# Patient Record
Sex: Male | Born: 1990 | Race: Black or African American | Hispanic: No | Marital: Single | Smoking: Former smoker
Health system: Southern US, Community
[De-identification: ages and names within clinical notes are randomized; demographics above are authoritative.]

## PROBLEM LIST (undated history)

## (undated) DIAGNOSIS — Z789 Other specified health status: Secondary | ICD-10-CM

---

## 2008-05-06 ENCOUNTER — Encounter (INDEPENDENT_AMBULATORY_CARE_PROVIDER_SITE_OTHER): Payer: Self-pay | Admitting: Orthopedic Surgery

## 2008-05-06 ENCOUNTER — Ambulatory Visit (HOSPITAL_BASED_OUTPATIENT_CLINIC_OR_DEPARTMENT_OTHER): Admission: RE | Admit: 2008-05-06 | Discharge: 2008-05-06 | Payer: Self-pay | Admitting: Orthopedic Surgery

## 2008-05-06 HISTORY — PX: GANGLION CYST EXCISION: SHX1691

## 2009-01-16 ENCOUNTER — Emergency Department (HOSPITAL_COMMUNITY): Admission: EM | Admit: 2009-01-16 | Discharge: 2009-01-16 | Payer: Self-pay | Admitting: Emergency Medicine

## 2011-01-01 NOTE — Op Note (Signed)
NAMEMarland Kitchen  NOVAK, STGERMAINE NO.:  [id number]   MEDICAL RECORD NO.:  [id number]          PATIENT TYPE:  AMB   LOCATION:  DSC                          FACILITY:  [facility]   PHYSICIAN:  Katy Fitch. Sypher, M.D. DATE OF BIRTH:  Mar 24, 1991   DATE OF PROCEDURE:  05/06/2008  DATE OF DISCHARGE:                               OPERATIVE REPORT   PREOPERATIVE DIAGNOSIS:  Painful mass, dorsal aspect left wrist, rule  out tenosynovitis versus dorsal ganglion cyst.   POSTOPERATIVE DIAGNOSIS:  Dorsal ganglion cyst originating over the  midcarpal joint and scapholunate ligament.   OPERATION:  Resection of dorsal myxomatous cyst, left wrist.   OPERATING SURGEON:  Katy Fitch. Sypher, MD   ASSISTANT:  Annye Rusk PA-C   ANESTHESIA:  General by LMA.   SUPERVISING ANESTHESIOLOGIST:  Dr. Jean Rosenthal.   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  He is a member of the [employer].   Several months back he noted a fullness of the dorsal aspect of his left  wrist and after repetitively practicing drumming and while playing for  football games, he has had increasing pain.  Clinical examination  revealed a mass in the dorsal aspect of the wrist.  He is referred by  his primary care physician for evaluation and management of this  predicament.   Clinical examination revealed a probable ganglion cyst.  It was not  mobile with finger extensor motion.   We advised proceeding with excisional biopsy.   Preoperatively, Clinton Roach and his father were carefully advised that  myxomatous cyst or ganglions are degenerative in nature.  We are unable  to sacrifice their tissue of origin.  Because of this, despite our best  efforts at debridement, there is always a small risk of recurrence.  They fully appreciate this fact.   After informed consent, Clinton Roach is brought to the operating room at  this time.  Preoperatively, he was interviewed by Dr.  Jean Rosenthal, who  recommended general anesthesia by LMA technique.  He is brought to room  one, placed in supine position on the operating table and under Dr.  Edison Pace direct supervision, general anesthesia by LMA technique  induced.   The left arm was prepped with Betadine soap solution, sterilely draped.  A pneumatic tourniquet was applied in proximal brachium.  Following  exsanguination of the left arm with Esmarch bandage, the arterial  tourniquet was inflated to 250 mmHg.   Procedure commenced with a short transverse incision directly over the  mass.  Subcutaneous tissues were carefully divided identifying a  myxomatous cyst that was presenting between the ring and long finger  extensors.  The cyst was meticulously dissected circumferentially with  release of synovium.  It had a broad-based origin directly over the  midcarpal joint.  This was dissected to the capsule, sparing the dorsal  intercarpal ligament structures.  This was followed directly to the  distal margin of the scapholunate ligament.  The cyst was removed with  rongeurs directly off the ligament followed by electrocautery of the  surrounding tissues.   No  other areas of mucin formation were identified.  The wound was  irrigated and subsequently repaired with subdermal sutures of 4-0 Vicryl  and intradermal 2-0 Prolene.  A compressive dressing was applied with a  bolster to provide compression in this region.   Clinton Roach tolerated the procedure well.  He was awakened from general  anesthesia, transferred to recovery room with stable vital signs.   We will see him back for followup in the office in 1 week.  At that  time, we anticipate suture removal, Steri-Strips application, and  initiation of range of motion program.   We have told him he may return to practicing on the [employer] in  approximately 10 days.      Katy Fitch Sypher, M.D.  Electronically Signed     RVS/MEDQ  D:  05/06/2008  T:  05/07/2008   Job:  [id number]

## 2012-11-17 DIAGNOSIS — Z0271 Encounter for disability determination: Secondary | ICD-10-CM

## 2012-12-04 ENCOUNTER — Ambulatory Visit: Payer: 59 | Admitting: Family Medicine

## 2012-12-04 ENCOUNTER — Ambulatory Visit: Payer: 59

## 2012-12-04 VITALS — BP 136/84 | HR 46 | Temp 98.1°F | Resp 16 | Ht 70.0 in | Wt 153.0 lb

## 2012-12-04 DIAGNOSIS — M25539 Pain in unspecified wrist: Secondary | ICD-10-CM

## 2012-12-04 DIAGNOSIS — M25532 Pain in left wrist: Secondary | ICD-10-CM

## 2012-12-04 MED ORDER — MELOXICAM 7.5 MG PO TABS: 7.5000 mg | ORAL_TABLET | Freq: Every day | ORAL | Status: DC

## 2012-12-04 NOTE — Patient Instructions (Addendum)
We are going to refer you back to see Dr. Teressa Senter.  If any other problems or worsening in the meantime please let us know  Use the splint as needed for comfort and the mobic as needed for pain

## 2012-12-04 NOTE — Progress Notes (Signed)
[facility] [address] [place] [address] [phone]  Date:  12/04/2012   Name:  Clinton Roach   DOB:  1991-07-19   MRN:  [id number]  PCP:  No primary provider on file.    Chief Complaint: Wrist Pain   History of Present Illness:  Clinton Roach is a 21 y.o. very pleasant male patient who presents with the following:  He had a ganglion cyst removed from his left wrist by Dr. Teressa Senter in 2009. The cyst returned about 3 years ago.  Normally it will wax and wane in size, but not be painful.  2 or 3 weeks ago he noted onset of pain from the cyst. The ring and pinky fingers of the left hand are now also going numb- this comes and goes.  No injury to the wrist.  He is otherwise generally healthy.   There is no problem list on file for this patient.   History reviewed. No pertinent past medical history.  Past Surgical History  Procedure Laterality Date  . Wrist surgery Left 2009    History  Substance Use Topics  . Smoking status: Current Some Day Smoker  . Smokeless tobacco: Not on file  . Alcohol Use: Yes    History reviewed. No pertinent family history.  No Known Allergies  Medication list has been reviewed and updated.  No current outpatient prescriptions on file prior to visit.   No current facility-administered medications on file prior to visit.    Review of Systems:  As per HPI- otherwise negative.   Physical Examination: Filed Vitals:   12/04/12 1746  BP: 136/84  Pulse: 46  Temp: 98.1 F (36.7 C)  Resp: 16   Filed Vitals:   12/04/12 1746  Height: 5\' 10"  (1.778 m)  Weight: 153 lb (69.4 kg)   Body mass index is 21.95 kg/(m^2). Ideal Body Weight: Weight in (lb) to have BMI = 25: 173.9 Noted pulse in the 40s x 2 sets of VS when he was at the ED in 2010  GEN: WDWN, NAD, Non-toxic, A & O x 3, healthy and fit appearing HEENT: Atraumatic, Normocephalic. Neck supple. No masses, No LAD. Ears and Nose: No external  deformity. CV: RRR, No M/G/R. No JVD. No thrill. No extra heart sounds. PULM: CTA B, no wheezes, crackles, rhonchi. No retractions. No resp. distress. No accessory muscle use. EXTR: No c/c/e NEURO Normal gait.  PSYCH: Normally interactive. Conversant. Not depressed or anxious appearing.  Calm demeanor.  Left wrist: scar from past surgery on dorsum.  Proximal to this scar is a nickel sized, slightly raised, tender area that seems consistent with a cyst.  No redness or evidence of infection.  He has normal strength of the hand and wrist except for slightly decreased grip strength on the left.  Also complains of decreased sensation in the left ring and pinky fingers compared to the right.  Normal cap refill, strong radial pulse  [facility] reading (PRIMARY) by  Dr. Patsy Lager. Left wrist: negative LEFT WRIST - COMPLETE 3+ VIEW  Comparison: None  Findings: No acute bony abnormality. Specifically, no fracture, subluxation, or dislocation. Soft tissues are intact. Joint spaces are maintained. Normal bone mineralization.  IMPRESSION: No bony abnormality.  Assessment and Plan: Pain, wrist joint, left - Plan: DG Wrist Complete Left, meloxicam (MOBIC) 7.5 MG tablet, Ambulatory referral to Hand Surgery  Probably recurrent ganglion cyst.  Will refer back to Dr. Teressa Senter.  Mobic as needed for pain and placed  in a non- spica splint.    Meds ordered this encounter  Medications  . meloxicam (MOBIC) 7.5 MG tablet    Sig: Take 1 tablet (7.5 mg total) by mouth daily. Take one or two tablets a day as needed for wrist pain    Dispense:  45 tablet    Refill:  0    Signed Abbe Amsterdam, MD

## 2012-12-11 ENCOUNTER — Telehealth: Payer: Self-pay

## 2012-12-11 NOTE — Telephone Encounter (Signed)
Clinton Roach is calling about short term disability Claim # is [id number] Call back number is [phone]

## 2012-12-14 ENCOUNTER — Telehealth: Payer: Self-pay | Admitting: *Deleted

## 2012-12-14 NOTE — Telephone Encounter (Signed)
Telephoned pt in regards to [employer] disability- Number has been disconnected. Telephoned [employer]- The claim is actually through Dr. Teressa Senter, they just need the office note and xray results faxed in for review. Paperwork does not need to be completed. Faxed to [phone]

## 2012-12-15 ENCOUNTER — Other Ambulatory Visit: Payer: Self-pay | Admitting: Orthopedic Surgery

## 2012-12-15 ENCOUNTER — Encounter (HOSPITAL_BASED_OUTPATIENT_CLINIC_OR_DEPARTMENT_OTHER): Payer: Self-pay | Admitting: *Deleted

## 2012-12-16 NOTE — H&P (Addendum)
  Clinton Roach is an 22 y.o. male.   Chief Complaint: c/o mass left wrist dorsal aspect. HPI:  Clinton Roach is a well known 22 year-old gentleman who works as Therapist, music at [employer].  He has history of pain and swelling over the dorsal aspect of his left wrist.  In September, 2009 we removed a significant dorsal ganglion cyst from his midcarpal joint on the left.  He had a lengthy period of symptom free enjoyment of using his wrist vigorously on the job at [employer].  During the past two months he has had pain on the dorsal aspect of his wrist aggravated by full dorsal flexion or full palmar flexion that lancinates in the forearm.  He has been on meloxicam 7.5 mg. b.i.d. for pain without significant relief.   No past medical history on file.  Past Surgical History  Procedure Laterality Date  . Ganglion cyst excision Left 05/06/2008    resection dorsal cyst left wrist    No family history on file. Social History:  reports that he has been smoking.  He does not have any smokeless tobacco history on file. He reports that  drinks alcohol. He reports that he does not use illicit drugs.  Allergies: No Known Allergies  No prescriptions prior to admission    No results found for this or any previous visit (from the past 48 hour(s)).  No results found.   Pertinent items are noted in HPI.  There were no vitals taken for this visit.  General appearance: alert Head: Normocephalic, without obvious abnormality Neck: supple, symmetrical, trachea midline Resp: clear to auscultation bilaterally Cardio: regular rate and rhythm GI: normal findings: bowel sounds normal Extremities:.  His symptoms appear to be provoked by wrist motion. He has a well healed surgical incision dorsally. Neurovascular exam is within normal limits. He has FROM of his digits.    X-rays of his wrist were obtained at the [facility] on 12[facility]04[facility]12.  A copy of these films, three views, AP, lateral and oblique are reviewed.  He  has normal appearing wrist anatomy.  He is ulnar neutral.   Pulses: 2+ and symmetric Skin: normal Neurologic: Grossly normal    Assessment/Plan Impression: Mass left wrist dorsal aspect with probable posterior interosseous nerve compression  Plan: To the OR for excision of mass of the left wrist.The procedure, risks,benefits and post-op course were discussed with the patient at length and they were in agreement with the plan.  DASNOIT,Chrissie Dacquisto J 12/16/2012, 3:51 PM   H&P documentation: 12/17/2012  -History and Physical Reviewed  -Patient has been re-examined  -No change in the plan of care  Wyn Forster, MD

## 2012-12-17 ENCOUNTER — Ambulatory Visit (HOSPITAL_BASED_OUTPATIENT_CLINIC_OR_DEPARTMENT_OTHER): Payer: 59 | Admitting: Anesthesiology

## 2012-12-17 ENCOUNTER — Encounter (HOSPITAL_BASED_OUTPATIENT_CLINIC_OR_DEPARTMENT_OTHER): Payer: Self-pay | Admitting: Anesthesiology

## 2012-12-17 ENCOUNTER — Ambulatory Visit (HOSPITAL_BASED_OUTPATIENT_CLINIC_OR_DEPARTMENT_OTHER)
Admission: RE | Admit: 2012-12-17 | Discharge: 2012-12-17 | Disposition: A | Discharge status: Home / Self Care | Payer: 59 | Source: Ambulatory Visit | Attending: Orthopedic Surgery | Admitting: Orthopedic Surgery

## 2012-12-17 ENCOUNTER — Encounter (HOSPITAL_BASED_OUTPATIENT_CLINIC_OR_DEPARTMENT_OTHER)
Admission: RE | Disposition: A | Discharge status: Home / Self Care | Payer: Self-pay | Source: Ambulatory Visit | Attending: Orthopedic Surgery

## 2012-12-17 ENCOUNTER — Encounter (HOSPITAL_BASED_OUTPATIENT_CLINIC_OR_DEPARTMENT_OTHER): Payer: Self-pay

## 2012-12-17 DIAGNOSIS — M6740 Ganglion, unspecified site: Secondary | ICD-10-CM | POA: Insufficient documentation

## 2012-12-17 HISTORY — PX: GANGLION CYST EXCISION: SHX1691

## 2012-12-17 HISTORY — DX: Other specified health status: Z78.9

## 2012-12-17 LAB — POCT HEMOGLOBIN-HEMACUE: Hemoglobin: 15.7 g/dL (ref 13.0–17.0)

## 2012-12-17 SURGERY — EXCISION, GANGLION CYST, WRIST
Anesthesia: General | Site: Wrist | Laterality: Left | Wound class: Clean

## 2012-12-17 MED ORDER — PROPOFOL 10 MG/ML IV BOLUS
INTRAVENOUS | Status: DC | PRN
  Administered 2012-12-17: 200 mg via INTRAVENOUS

## 2012-12-17 MED ORDER — ONDANSETRON HCL 4 MG/2ML IJ SOLN
INTRAMUSCULAR | Status: DC | PRN
  Administered 2012-12-17: 4 mg via INTRAVENOUS

## 2012-12-17 MED ORDER — GLYCOPYRROLATE 0.2 MG/ML IJ SOLN
INTRAMUSCULAR | Status: DC | PRN
  Administered 2012-12-17: 0.2 mg via INTRAVENOUS

## 2012-12-17 MED ORDER — LIDOCAINE HCL (CARDIAC) 20 MG/ML IV SOLN
INTRAVENOUS | Status: DC | PRN
  Administered 2012-12-17: 100 mg via INTRAVENOUS

## 2012-12-17 MED ORDER — FENTANYL CITRATE 0.05 MG/ML IJ SOLN
INTRAMUSCULAR | Status: DC | PRN
  Administered 2012-12-17: 50 ug via INTRAVENOUS
  Administered 2012-12-17: 100 ug via INTRAVENOUS

## 2012-12-17 MED ORDER — MIDAZOLAM HCL 5 MG/5ML IJ SOLN
INTRAMUSCULAR | Status: DC | PRN
  Administered 2012-12-17: 2 mg via INTRAVENOUS

## 2012-12-17 MED ORDER — OXYCODONE HCL 5 MG/5ML PO SOLN: 5.0000 mg | Freq: Once | ORAL | Status: AC | PRN

## 2012-12-17 MED ORDER — CEFAZOLIN SODIUM-DEXTROSE 2-3 GM-% IV SOLR: 2.0000 g | INTRAVENOUS | Status: DC

## 2012-12-17 MED ORDER — LIDOCAINE HCL 2 % IJ SOLN
INTRAMUSCULAR | Status: DC | PRN
  Administered 2012-12-17: 3 mL

## 2012-12-17 MED ORDER — MEPERIDINE HCL 25 MG/ML IJ SOLN: 6.2500 mg | INTRAMUSCULAR | Status: DC | PRN

## 2012-12-17 MED ORDER — MIDAZOLAM HCL 2 MG/ML PO SYRP: 0.5000 mg/kg | ORAL_SOLUTION | Freq: Once | ORAL | Status: DC | PRN

## 2012-12-17 MED ORDER — OXYCODONE HCL 5 MG PO TABS
5.0000 mg | ORAL_TABLET | Freq: Once | ORAL | Status: AC | PRN
  Administered 2012-12-17: 5 mg via ORAL

## 2012-12-17 MED ORDER — DEXAMETHASONE SODIUM PHOSPHATE 4 MG/ML IJ SOLN
INTRAMUSCULAR | Status: DC | PRN
  Administered 2012-12-17: 8 mg via INTRAVENOUS

## 2012-12-17 MED ORDER — OXYCODONE-ACETAMINOPHEN 5-325 MG PO TABS: ORAL_TABLET | ORAL | Status: DC

## 2012-12-17 MED ORDER — MIDAZOLAM HCL 2 MG/2ML IJ SOLN: 1.0000 mg | INTRAMUSCULAR | Status: DC | PRN

## 2012-12-17 MED ORDER — HYDROMORPHONE HCL PF 1 MG/ML IJ SOLN
0.2500 mg | INTRAMUSCULAR | Status: DC | PRN
  Administered 2012-12-17 (×2): 0.5 mg via INTRAVENOUS

## 2012-12-17 MED ORDER — FENTANYL CITRATE 0.05 MG/ML IJ SOLN: 50.0000 ug | INTRAMUSCULAR | Status: DC | PRN

## 2012-12-17 MED ORDER — CHLORHEXIDINE GLUCONATE 4 % EX LIQD: 60.0000 mL | Freq: Once | CUTANEOUS | Status: DC

## 2012-12-17 MED ORDER — LACTATED RINGERS IV SOLN
INTRAVENOUS | Status: DC
  Administered 2012-12-17 (×2): via INTRAVENOUS

## 2012-12-17 MED ORDER — ONDANSETRON HCL 4 MG/2ML IJ SOLN: 4.0000 mg | Freq: Once | INTRAMUSCULAR | Status: DC | PRN

## 2012-12-17 SURGICAL SUPPLY — 42 items
BANDAGE ADHESIVE 1X3 (GAUZE/BANDAGES/DRESSINGS) IMPLANT
BANDAGE ELASTIC 3 VELCRO ST LF (GAUZE/BANDAGES/DRESSINGS) ×2 IMPLANT
BANDAGE GAUZE ELAST BULKY 4 IN (GAUZE/BANDAGES/DRESSINGS) IMPLANT
BLADE MINI RND TIP GREEN BEAV (BLADE) IMPLANT
BLADE SURG 15 STRL LF DISP TIS (BLADE) ×1 IMPLANT
BLADE SURG 15 STRL SS (BLADE) ×1
BNDG ESMARK 4X9 LF (GAUZE/BANDAGES/DRESSINGS) IMPLANT
BRUSH SCRUB EZ PLAIN DRY (MISCELLANEOUS) ×2 IMPLANT
CLOTH BEACON ORANGE TIMEOUT ST (SAFETY) ×2 IMPLANT
CORDS BIPOLAR (ELECTRODE) ×2 IMPLANT
COVER MAYO STAND STRL (DRAPES) ×2 IMPLANT
COVER TABLE BACK 60X90 (DRAPES) ×2 IMPLANT
CUFF TOURNIQUET SINGLE 18IN (TOURNIQUET CUFF) ×2 IMPLANT
DECANTER SPIKE VIAL GLASS SM (MISCELLANEOUS) IMPLANT
DRAPE EXTREMITY T 121X128X90 (DRAPE) ×2 IMPLANT
DRAPE SURG 17X23 STRL (DRAPES) ×2 IMPLANT
GLOVE BIOGEL M STRL SZ7.5 (GLOVE) ×2 IMPLANT
GLOVE BIOGEL PI IND STRL 6.5 (GLOVE) ×2 IMPLANT
GLOVE BIOGEL PI INDICATOR 6.5 (GLOVE) ×2
GLOVE ECLIPSE 6.5 STRL STRAW (GLOVE) ×2 IMPLANT
GLOVE ORTHO TXT STRL SZ7.5 (GLOVE) ×2 IMPLANT
GOWN BRE IMP PREV XXLGXLNG (GOWN DISPOSABLE) ×4 IMPLANT
GOWN PREVENTION PLUS XLARGE (GOWN DISPOSABLE) ×2 IMPLANT
NEEDLE 27GAX1X1/2 (NEEDLE) ×2 IMPLANT
PACK BASIN DAY SURGERY FS (CUSTOM PROCEDURE TRAY) ×2 IMPLANT
PAD CAST 3X4 CTTN HI CHSV (CAST SUPPLIES) ×1 IMPLANT
PADDING CAST ABS 4INX4YD NS (CAST SUPPLIES) ×1
PADDING CAST ABS COTTON 4X4 ST (CAST SUPPLIES) ×1 IMPLANT
PADDING CAST COTTON 3X4 STRL (CAST SUPPLIES) ×1
SPLINT PLASTER CAST XFAST 3X15 (CAST SUPPLIES) ×4 IMPLANT
SPLINT PLASTER XTRA FASTSET 3X (CAST SUPPLIES) ×4
SPONGE GAUZE 4X4 12PLY (GAUZE/BANDAGES/DRESSINGS) ×2 IMPLANT
STOCKINETTE 4X48 STRL (DRAPES) ×2 IMPLANT
STRIP CLOSURE SKIN 1/2X4 (GAUZE/BANDAGES/DRESSINGS) IMPLANT
SUT PROLENE 3 0 PS 2 (SUTURE) ×2 IMPLANT
SUT VIC AB 4-0 P-3 18XBRD (SUTURE) ×1 IMPLANT
SUT VIC AB 4-0 P3 18 (SUTURE) ×1
SYR 3ML 23GX1 SAFETY (SYRINGE) IMPLANT
SYR CONTROL 10ML LL (SYRINGE) ×2 IMPLANT
TOWEL OR 17X24 6PK STRL BLUE (TOWEL DISPOSABLE) ×4 IMPLANT
TRAY DSU PREP LF (CUSTOM PROCEDURE TRAY) ×2 IMPLANT
UNDERPAD 30X30 INCONTINENT (UNDERPADS AND DIAPERS) ×2 IMPLANT

## 2012-12-17 NOTE — Anesthesia Procedure Notes (Signed)
Procedure Name: LMA Insertion Date/Time: 12/17/2012 12:05 PM Performed by: Caren Macadam Pre-anesthesia Checklist: Patient identified, Emergency Drugs available, Suction available and Patient being monitored Patient Re-evaluated:Patient Re-evaluated prior to inductionOxygen Delivery Method: Circle System Utilized Preoxygenation: Pre-oxygenation with 100% oxygen Intubation Type: IV induction Ventilation: Mask ventilation without difficulty LMA: LMA inserted LMA Size: 4.0 Number of attempts: 1 Airway Equipment and Method: bite block Placement Confirmation: positive ETCO2 and breath sounds checked- equal and bilateral Tube secured with: Tape Dental Injury: Teeth and Oropharynx as per pre-operative assessment

## 2012-12-17 NOTE — Brief Op Note (Signed)
12/17/2012  12:52 PM  PATIENT:  Clinton Roach  22 y.o. male  PRE-OPERATIVE DIAGNOSIS:  LEFT DORSAL GANGLION  POST-OPERATIVE DIAGNOSIS:  LEFT DORSAL GANGLION  PROCEDURE:  Procedure(s): EXCISION MASS LEFT WRIST (Left)  SURGEON:  Surgeon(s) and Role:    * Wyn Forster., MD - Primary  PHYSICIAN ASSISTANT:   ASSISTANTS:surgical technician  ANESTHESIA:   general  EBL:  Total I/O In: 500 [I.V.:500] Out: -   BLOOD ADMINISTERED:none  DRAINS: none   LOCAL MEDICATIONS USED:  XYLOCAINE   SPECIMEN:  Biopsy / Limited Resection  DISPOSITION OF SPECIMEN:  PATHOLOGY  COUNTS:  YES  TOURNIQUET:   Total Tourniquet Time Documented: Upper Arm (laterality) - 33 minutes Total: Upper Arm (laterality) - 33 minutes   DICTATION: .Other Dictation: Dictation Number [id number]  PLAN OF CARE: Discharge to home after PACU  PATIENT DISPOSITION:  PACU - hemodynamically stable.   Delay start of Pharmacological VTE agent (>24hrs) due to surgical blood loss or risk of bleeding: not applicable

## 2012-12-17 NOTE — Anesthesia Preprocedure Evaluation (Signed)
Anesthesia Evaluation  Patient identified by MRN, date of birth, ID band Patient awake    Reviewed: Allergy & Precautions, H&P , NPO status , Patient's Chart, lab work & pertinent test results  Airway Mallampati: I TM Distance: >3 FB Neck ROM: full    Dental   Pulmonary          Cardiovascular     Neuro/Psych    GI/Hepatic   Endo/Other    Renal/GU      Musculoskeletal   Abdominal   Peds  Hematology   Anesthesia Other Findings   Reproductive/Obstetrics                           Anesthesia Physical Anesthesia Plan  ASA: I  Anesthesia Plan: General   Post-op Pain Management:    Induction:   Airway Management Planned:   Additional Equipment:   Intra-op Plan:   Post-operative Plan:   Informed Consent: I have reviewed the patients History and Physical, chart, labs and discussed the procedure including the risks, benefits and alternatives for the proposed anesthesia with the patient or authorized representative who has indicated his/her understanding and acceptance.     Plan Discussed with: Anesthesiologist and Surgeon  Anesthesia Plan Comments:         Anesthesia Quick Evaluation

## 2012-12-17 NOTE — Transfer of Care (Signed)
Immediate Anesthesia Transfer of Care Note  Patient: Clinton Roach  Procedure(s) Performed: Procedure(s) (LRB): EXCISION MASS LEFT WRIST (Left)  Patient Location: PACU  Anesthesia Type: General  Level of Consciousness: awake, oriented, sedated and patient cooperative  Airway & Oxygen Therapy: Patient Spontanous Breathing and Patient connected to face mask oxygen  Post-op Assessment: Report given to PACU RN and Post -op Vital signs reviewed and stable  Post vital signs: Reviewed and stable  Complications: No apparent anesthesia complications

## 2012-12-17 NOTE — Anesthesia Postprocedure Evaluation (Signed)
Anesthesia Post Note  Patient: Clinton Roach  Procedure(s) Performed: Procedure(s) (LRB): EXCISION MASS LEFT WRIST (Left)  Anesthesia type: general  Patient location: PACU  Post pain: Pain level controlled  Post assessment: Patient's Cardiovascular Status Stable  Last Vitals:  Filed Vitals:   12/17/12 1315  BP: 126/70  Pulse: 87  Temp:   Resp: 21    Post vital signs: Reviewed and stable  Level of consciousness: sedated  Complications: No apparent anesthesia complications

## 2012-12-17 NOTE — Op Note (Signed)
[id number] 

## 2012-12-18 NOTE — Op Note (Signed)
NAMEMarland Kitchen  Clinton, Roach NO.:  [id number]  MEDICAL RECORD NO.:  [id number]  LOCATION:                                 FACILITY:  PHYSICIAN:  Clinton Roach, M.D. DATE OF BIRTH:  13-Jul-1991  DATE OF PROCEDURE:  12/17/2012 DATE OF DISCHARGE:                              OPERATIVE REPORT   PREOPERATIVE DIAGNOSIS:  Painful mass, dorsal aspect of the left wrist with likely posterior interosseous nerve compression symptoms.  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  OPERATION:  Re-exploration in dorsal aspect of the left wrist with midcarpal joint arthrotomy and removal of a large myxoid cyst infiltrating through the capsule of the midcarpal joint, also direct inspection of the scapholunate ligament identifying a second lobe of the recurrent cyst directly growing from the dorsal aspect of the scapholunate ligament.  OPERATING SURGEON:  Clinton Roach, M.D.  ASSISTANT:  Surgical technician.  ANESTHESIA:  General by LMA.  SUPERVISING ANESTHESIOLOGIST:  Clinton Roach, M.D.  INDICATIONS:  Clinton Roach is a 22 year old gentleman, currently working at [employer] doing repetitive lifting.  In September 2009, we removed a myxoid cyst on the dorsal aspect of his left wrist.  He was symptom free for nearly 4 years.  With the hilar repetitive lifting he does on his job, he developed pain on the dorsal aspect of his wrist and was aware of a fullness overlying the scapholunate ligament region.  He did induce that more likely not he was having a recurrent cyst.  At the time of his original surgery, we had a long informed consent during which we pointed out that myxoid cysts can recur as they are degenerative in nature and poorly understood.  He understood in 2009, that with continued heavy use of his hand and arm as well as with a history of a myxoid cyst forming in  the past, he was at risk to develop another cyst.  Upon examination in the office, he was noted to have what appeared to be a large myxoid cyst deep to the extensor retinaculum overlying the scapholunate ligament region.  We had a detailed informed consent once again, with Clinton Roach and his mom pointing out that we cannot control the behavior of the fibroblasts completely.  We cannot sacrifice the scapholunate ligament, but we will debride it in the manner of a D and C.  Typically, we will fire the discolored ligament fibers and the myxoid material.  Questions regarding the anticipated surgery invited and answered in detail.  We did discuss possible resection of the posterior interosseous nerve if it was found to be intimately involved with the mass, so as to prevent neuroma symptoms.  After informed consent, Clinton Roach returns to the operating room at this time anticipating our best efforts at cyst excision.  PROCEDURE:  Clinton Roach was interviewed by Clinton Roach of Anesthesia in the holding area and after detailed informed consent, selected general anesthesia by LMA technique.  In room 1 under Dr. Deirdre Priest direct supervision, general anesthesia by LMA technique was induced followed.  The left hand and arm were prepped with Betadine soap and solution, sterilely  draped with sterile stockinette and impervious arthroscopy drapes.  Following exsanguination of the left arm with Esmarch bandage, an arterial tourniquet on the proximal left brachium was inflated to 220 mmHg.  Following routine surgical time-out, we proceeded with resection of the prior surgical scar.  Subcutaneous tissues were carefully divided revealing the anticipated postoperative scar curing the extensor retinacular fibers.  The great care we dissected out the distal margin of the extensor retinaculum and by placing the wrist in full palmar flexion identified the outline of the cyst.  I retracted the  retinaculum proximally and performed a careful exploration between the second and fourth dorsal compartments, identifying the cyst/mass.  We carefully preserved the dorsal intercarpal ligament and performed 2 arthrotomies 1 over the midcarpal joint, removing 1 lobe of the cyst was presenting distal to the ligament and proximal to the ligament directly over the scapholunate ligament, resecting a second lobe of the cyst.  The more proximal lobe went directly to the scapholunate ligament which was creamy yellow in color, which is the typical finding with recurrent mucoid degenerative cyst.  A micro rongeur was used to debride the cyst down to normal-appearing scapholunate ligament fibers.  Care was taken not to sacrifice any of the main dorsal intercarpal ligament fibers.  We identified the scaphoid and lunate by direct inspection.  I also identified the neck of the capitate and the head of the capitate and removed all mucinous-appearing tissues in the dorsal capsule.  Care was taken to preserve the dorsal intercarpal ligament.  Electrocautery was used to electrodesiccate the margins of the resection and both the radiocarpal and midcarpal arthrotomy sites were left open to close by secondary intention.  The wound was inspected for bleeding points, followed by layered closure of the subcutaneous tissues with 4-0 Vicryl and intradermal 3-0 Prolene with a Steri-Strip.  A 2% lidocaine was infiltrated for postoperative analgesia.  Clinton Roach was placed in compressive dressing with a volar plaster splint.  We will plan on mobilizing his wrist for approximately 2-3 weeks due to the recurrent nature of this predicament.  He will have some wrist stiffness due to immobilization.  However, accumulated experience by the hand surgeon community has suggested that immobilization can be helpful in preventing recurrence of these difficulty cysts.  NOTE FOR AFTERCARE:  He is provided a prescription for  Percocet 5 mg 1 or 2 tablets p.o. q.4-6 hours p.r.n. pain, 20 tablets without refill.     Clinton Roach, M.D.     RVS/MEDQ  D:  12/17/2012  T:  12/18/2012  Job:  [id number]

## 2012-12-21 ENCOUNTER — Encounter (HOSPITAL_BASED_OUTPATIENT_CLINIC_OR_DEPARTMENT_OTHER): Payer: Self-pay | Admitting: Orthopedic Surgery

## 2012-12-21 NOTE — Addendum Note (Signed)
Addendum created 12/21/12 0640 by Lance Coon, CRNA   Modules edited: Charges VN

## 2014-06-18 IMAGING — CR DG WRIST COMPLETE 3+V*L*
1 series · 1 of 1 positions shown · non-contrast
Comparison: None

CLINICAL DATA: Pain.  A recurrent ganglion cyst.

LEFT WRIST - COMPLETE 3+ VIEW

[PA]
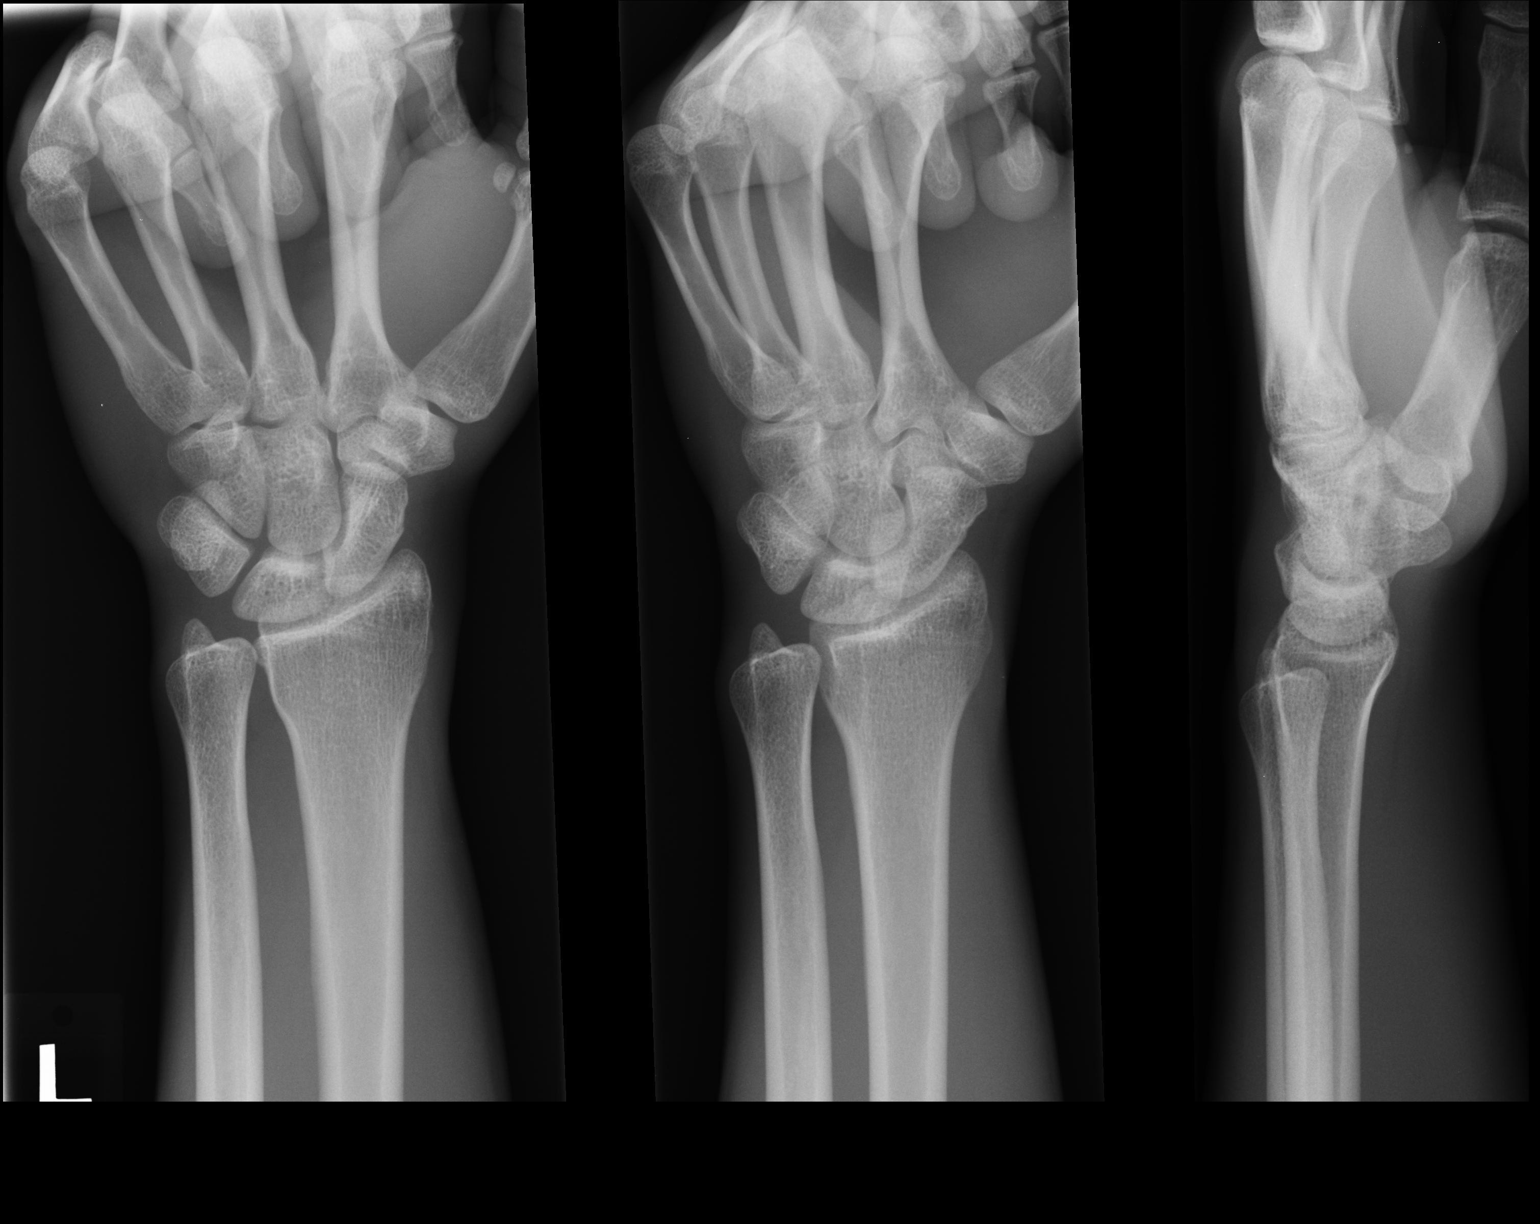

[1 of 1 positions shown; findings below may reference images not displayed]

FINDINGS: No acute bony abnormality.  Specifically, no fracture,
subluxation, or dislocation.  Soft tissues are intact. Joint spaces
are maintained.  Normal bone mineralization.
IMPRESSION: No bony abnormality..

## 2014-08-01 ENCOUNTER — Other Ambulatory Visit: Payer: Self-pay | Admitting: Physician Assistant

## 2014-08-01 ENCOUNTER — Ambulatory Visit
Admission: RE | Admit: 2014-08-01 | Discharge: 2014-08-01 | Disposition: A | Discharge status: Home / Self Care | Payer: 59 | Source: Ambulatory Visit | Attending: Physician Assistant | Admitting: Physician Assistant

## 2014-08-01 DIAGNOSIS — S6991XA Unspecified injury of right wrist, hand and finger(s), initial encounter: Secondary | ICD-10-CM

## 2015-03-26 ENCOUNTER — Emergency Department (HOSPITAL_COMMUNITY): Payer: 59

## 2015-03-26 ENCOUNTER — Emergency Department (HOSPITAL_COMMUNITY)
Admission: EM | Admit: 2015-03-26 | Discharge: 2015-03-26 | Disposition: A | Discharge status: Home / Self Care | Payer: 59 | Attending: Emergency Medicine | Admitting: Emergency Medicine

## 2015-03-26 ENCOUNTER — Encounter (HOSPITAL_COMMUNITY): Payer: Self-pay | Admitting: *Deleted

## 2015-03-26 DIAGNOSIS — Z720 Tobacco use: Secondary | ICD-10-CM | POA: Insufficient documentation

## 2015-03-26 DIAGNOSIS — Y999 Unspecified external cause status: Secondary | ICD-10-CM | POA: Insufficient documentation

## 2015-03-26 DIAGNOSIS — S51052A Open bite, left elbow, initial encounter: Secondary | ICD-10-CM | POA: Insufficient documentation

## 2015-03-26 DIAGNOSIS — Y9367 Activity, basketball: Secondary | ICD-10-CM | POA: Insufficient documentation

## 2015-03-26 DIAGNOSIS — Z791 Long term (current) use of non-steroidal anti-inflammatories (NSAID): Secondary | ICD-10-CM | POA: Insufficient documentation

## 2015-03-26 DIAGNOSIS — W503XXA Accidental bite by another person, initial encounter: Secondary | ICD-10-CM | POA: Insufficient documentation

## 2015-03-26 DIAGNOSIS — Z23 Encounter for immunization: Secondary | ICD-10-CM | POA: Insufficient documentation

## 2015-03-26 DIAGNOSIS — Y92310 Basketball court as the place of occurrence of the external cause: Secondary | ICD-10-CM | POA: Insufficient documentation

## 2015-03-26 LAB — CBC WITH DIFFERENTIAL/PLATELET
Basophils Absolute: 0 10*3/uL (ref 0.0–0.1)
Basophils Relative: 0 % (ref 0–1)
Eosinophils Absolute: 0.2 10*3/uL (ref 0.0–0.7)
Eosinophils Relative: 4 % (ref 0–5)
HCT: 39.3 % (ref 39.0–52.0)
Hemoglobin: 13.3 g/dL (ref 13.0–17.0)
Lymphocytes Relative: 33 % (ref 12–46)
Lymphs Abs: 1.8 10*3/uL (ref 0.7–4.0)
MCH: 29.9 pg (ref 26.0–34.0)
MCHC: 33.8 g/dL (ref 30.0–36.0)
MCV: 88.3 fL (ref 78.0–100.0)
Monocytes Absolute: 0.6 10*3/uL (ref 0.1–1.0)
Monocytes Relative: 11 % (ref 3–12)
Neutro Abs: 2.9 10*3/uL (ref 1.7–7.7)
Neutrophils Relative %: 52 % (ref 43–77)
Platelets: 136 10*3/uL — ABNORMAL LOW (ref 150–400)
RBC: 4.45 MIL/uL (ref 4.22–5.81)
RDW: 13.1 % (ref 11.5–15.5)
WBC: 5.5 10*3/uL (ref 4.0–10.5)

## 2015-03-26 MED ORDER — AMOXICILLIN-POT CLAVULANATE 875-125 MG PO TABS
1.0000 | ORAL_TABLET | Freq: Once | ORAL | Status: AC
  Administered 2015-03-26: 1 via ORAL
  Filled 2015-03-26: qty 1

## 2015-03-26 MED ORDER — IBUPROFEN 400 MG PO TABS
600.0000 mg | ORAL_TABLET | Freq: Once | ORAL | Status: AC
  Administered 2015-03-26: 600 mg via ORAL
  Filled 2015-03-26 (×2): qty 1

## 2015-03-26 MED ORDER — AMOXICILLIN-POT CLAVULANATE 875-125 MG PO TABS: 1.0000 | ORAL_TABLET | Freq: Two times a day (BID) | ORAL | Status: DC

## 2015-03-26 MED ORDER — OXYCODONE-ACETAMINOPHEN 5-325 MG PO TABS
2.0000 | ORAL_TABLET | Freq: Once | ORAL | Status: AC
  Administered 2015-03-26: 2 via ORAL
  Filled 2015-03-26: qty 2

## 2015-03-26 MED ORDER — OXYCODONE-ACETAMINOPHEN 5-325 MG PO TABS: 1.0000 | ORAL_TABLET | ORAL | Status: DC | PRN

## 2015-03-26 MED ORDER — TETANUS-DIPHTH-ACELL PERTUSSIS 5-2.5-18.5 LF-MCG/0.5 IM SUSP
0.5000 mL | Freq: Once | INTRAMUSCULAR | Status: AC
  Administered 2015-03-26: 0.5 mL via INTRAMUSCULAR
  Filled 2015-03-26: qty 0.5
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
## 2015-03-26 NOTE — Discharge Instructions (Signed)

## 2015-04-07 ENCOUNTER — Encounter (HOSPITAL_COMMUNITY): Payer: Self-pay | Admitting: Emergency Medicine

## 2015-04-07 ENCOUNTER — Inpatient Hospital Stay (HOSPITAL_COMMUNITY)
Admission: EM | Admit: 2015-04-07 | Discharge: 2015-04-09 | DRG: 603 | Disposition: A | Discharge status: Home / Self Care | Payer: 59 | Attending: Internal Medicine | Admitting: Internal Medicine

## 2015-04-07 ENCOUNTER — Emergency Department (HOSPITAL_COMMUNITY): Payer: 59

## 2015-04-07 DIAGNOSIS — M60022 Infective myositis, left upper arm: Secondary | ICD-10-CM | POA: Diagnosis not present

## 2015-04-07 DIAGNOSIS — Y9367 Activity, basketball: Secondary | ICD-10-CM | POA: Diagnosis not present

## 2015-04-07 DIAGNOSIS — L03114 Cellulitis of left upper limb: Principal | ICD-10-CM | POA: Diagnosis present

## 2015-04-07 DIAGNOSIS — L0390 Cellulitis, unspecified: Secondary | ICD-10-CM

## 2015-04-07 DIAGNOSIS — S51032A Puncture wound without foreign body of left elbow, initial encounter: Secondary | ICD-10-CM | POA: Diagnosis present

## 2015-04-07 DIAGNOSIS — M7032 Other bursitis of elbow, left elbow: Secondary | ICD-10-CM | POA: Diagnosis present

## 2015-04-07 DIAGNOSIS — M7022 Olecranon bursitis, left elbow: Secondary | ICD-10-CM | POA: Diagnosis not present

## 2015-04-07 DIAGNOSIS — W51XXXA Accidental striking against or bumped into by another person, initial encounter: Secondary | ICD-10-CM | POA: Diagnosis present

## 2015-04-07 DIAGNOSIS — L03119 Cellulitis of unspecified part of limb: Secondary | ICD-10-CM | POA: Diagnosis present

## 2015-04-07 LAB — BASIC METABOLIC PANEL
ANION GAP: 11 (ref 5–15)
BUN: 8 mg/dL (ref 6–20)
CO2: 26 mmol/L (ref 22–32)
Calcium: 8.8 mg/dL — ABNORMAL LOW (ref 8.9–10.3)
Chloride: 101 mmol/L (ref 101–111)
Creatinine, Ser: 1 mg/dL (ref 0.61–1.24)
GFR calc Af Amer: >60 mL/min (ref >60)
GLUCOSE: 109 mg/dL — AB (ref 65–99)
POTASSIUM: 3.8 mmol/L (ref 3.5–5.1)
SODIUM: 138 mmol/L (ref 135–145)

## 2015-04-07 LAB — CBC WITH DIFFERENTIAL/PLATELET
BASOS ABS: 0 10*3/uL (ref 0.0–0.1)
Basophils Relative: 0 % (ref 0–1)
EOS PCT: 2 % (ref 0–5)
Eosinophils Absolute: 0.1 10*3/uL (ref 0.0–0.7)
HCT: 39 % (ref 39.0–52.0)
Hemoglobin: 13.3 g/dL (ref 13.0–17.0)
LYMPHS PCT: 33 % (ref 12–46)
Lymphs Abs: 2.2 10*3/uL (ref 0.7–4.0)
MCH: 29.5 pg (ref 26.0–34.0)
MCHC: 34.1 g/dL (ref 30.0–36.0)
MCV: 86.5 fL (ref 78.0–100.0)
Monocytes Absolute: 0.6 10*3/uL (ref 0.1–1.0)
Monocytes Relative: 9 % (ref 3–12)
NEUTROS PCT: 56 % (ref 43–77)
Neutro Abs: 3.8 10*3/uL (ref 1.7–7.7)
PLATELETS: 233 10*3/uL (ref 150–400)
RBC: 4.51 MIL/uL (ref 4.22–5.81)
RDW: 12.4 % (ref 11.5–15.5)
WBC: 6.7 10*3/uL (ref 4.0–10.5)

## 2015-04-07 MED ORDER — ONDANSETRON HCL 4 MG PO TABS: 4.0000 mg | ORAL_TABLET | Freq: Four times a day (QID) | ORAL | Status: DC | PRN

## 2015-04-07 MED ORDER — OXYCODONE HCL 5 MG PO TABS
5.0000 mg | ORAL_TABLET | ORAL | Status: DC | PRN
  Administered 2015-04-07 – 2015-04-09 (×7): 5 mg via ORAL
  Filled 2015-04-07 (×7): qty 1

## 2015-04-07 MED ORDER — MORPHINE SULFATE (PF) 4 MG/ML IV SOLN
4.0000 mg | Freq: Once | INTRAVENOUS | Status: AC
  Administered 2015-04-07: 4 mg via INTRAVENOUS
  Filled 2015-04-07: qty 1

## 2015-04-07 MED ORDER — ACETAMINOPHEN 650 MG RE SUPP: 650.0000 mg | Freq: Four times a day (QID) | RECTAL | Status: DC | PRN

## 2015-04-07 MED ORDER — SODIUM CHLORIDE 0.9 % IV BOLUS (SEPSIS)
1000.0000 mL | Freq: Once | INTRAVENOUS | Status: AC
  Administered 2015-04-07: 1000 mL via INTRAVENOUS

## 2015-04-07 MED ORDER — SENNOSIDES-DOCUSATE SODIUM 8.6-50 MG PO TABS: 1.0000 | ORAL_TABLET | Freq: Every evening | ORAL | Status: DC | PRN

## 2015-04-07 MED ORDER — SODIUM CHLORIDE 0.9 % IV SOLN
INTRAVENOUS | Status: AC
  Administered 2015-04-07: 06:00:00 via INTRAVENOUS

## 2015-04-07 MED ORDER — ONDANSETRON HCL 4 MG/2ML IJ SOLN: 4.0000 mg | Freq: Four times a day (QID) | INTRAMUSCULAR | Status: DC | PRN

## 2015-04-07 MED ORDER — ALUM & MAG HYDROXIDE-SIMETH 200-200-20 MG/5ML PO SUSP: 30.0000 mL | Freq: Four times a day (QID) | ORAL | Status: DC | PRN

## 2015-04-07 MED ORDER — IOHEXOL 300 MG/ML  SOLN
100.0000 mL | Freq: Once | INTRAMUSCULAR | Status: AC | PRN
  Administered 2015-04-07: 100 mL via INTRAVENOUS

## 2015-04-07 MED ORDER — ACETAMINOPHEN 325 MG PO TABS
650.0000 mg | ORAL_TABLET | Freq: Four times a day (QID) | ORAL | Status: DC | PRN
  Administered 2015-04-07: 650 mg via ORAL
  Filled 2015-04-07: qty 2

## 2015-04-07 MED ORDER — SODIUM CHLORIDE 0.9 % IV SOLN
3.0000 g | Freq: Four times a day (QID) | INTRAVENOUS | Status: DC
  Administered 2015-04-07 – 2015-04-09 (×9): 3 g via INTRAVENOUS
  Filled 2015-04-07 (×10): qty 3

## 2015-04-07 MED ORDER — SODIUM CHLORIDE 0.9 % IV SOLN
3.0000 g | Freq: Once | INTRAVENOUS | Status: AC
  Administered 2015-04-07: 3 g via INTRAVENOUS
  Filled 2015-04-07: qty 3
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
## 2015-04-07 NOTE — Care Management Note (Signed)
Case Management Note  Patient Details  Name: Clinton Roach MRN: [id number] Date of Birth: Oct 18, 1990  Subjective/Objective:     Patient is indep, has transportation, has insurance, lives with his dad.  No needs at this time.               Action/Plan:   Expected Discharge Date:                  Expected Discharge Plan:  Home/Self Care  In-House Referral:     Discharge planning Services  CM Consult  Post Acute Care Choice:    Choice offered to:     DME Arranged:    DME Agency:     HH Arranged:    HH Agency:     Status of Service:  Completed, signed off  Medicare Important Message Given:    Date Medicare IM Given:    Medicare IM give by:    Date Additional Medicare IM Given:    Additional Medicare Important Message give by:     If discussed at Long Length of Stay Meetings, dates discussed:    Additional Comments:  Leone Haven, RN 04/07/2015, 11:38 AM

## 2015-04-08 LAB — CBC
HEMATOCRIT: 41 % (ref 39.0–52.0)
HEMOGLOBIN: 13.8 g/dL (ref 13.0–17.0)
MCH: 29.5 pg (ref 26.0–34.0)
MCHC: 33.7 g/dL (ref 30.0–36.0)
MCV: 87.6 fL (ref 78.0–100.0)
Platelets: 211 10*3/uL (ref 150–400)
RBC: 4.68 MIL/uL (ref 4.22–5.81)
RDW: 12.7 % (ref 11.5–15.5)
WBC: 6.2 10*3/uL (ref 4.0–10.5)

## 2015-04-08 MED ORDER — RISAQUAD PO CAPS
2.0000 | ORAL_CAPSULE | Freq: Every day | ORAL | Status: DC
  Administered 2015-04-08 – 2015-04-09 (×2): 2 via ORAL
  Filled 2015-04-08 (×2): qty 2
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
## 2015-04-09 MED ORDER — RISAQUAD PO CAPS
2.0000 | ORAL_CAPSULE | Freq: Every day | ORAL | Status: AC
Start: 2015-04-09 — End: 2015-04-18

## 2015-04-09 MED ORDER — AMOXICILLIN-POT CLAVULANATE 875-125 MG PO TABS: 1.0000 | ORAL_TABLET | Freq: Two times a day (BID) | ORAL | Status: AC
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
## 2015-04-09 NOTE — Progress Notes (Signed)
Orthopedic Tech Progress Note Patient Details:  Clinton Roach [birth year] [id number]  Ortho Devices Type of Ortho Device: Arm sling Ortho Device/Splint Location: lle Ortho Device/Splint Interventions: Application   Eduard Penkala 04/09/2015, 11:58 AM

## 2015-04-09 NOTE — Discharge Instructions (Signed)
Follow with Primary MD  in 7 days   Get CBC, CMP,hecked  by Primary MD next visit.     Disposition Home    Diet: Regular , with feeding assistance and aspiration precautions.  Please come back to ED if you develop fever, or left elbow swelling is worsening

## 2015-04-09 NOTE — Progress Notes (Signed)
04/09/15 Patient to go home today after completion of IV Antibiotic, IV removed and discharge instructions reviewed with patient.

## 2015-04-09 NOTE — Progress Notes (Signed)
PT Cancellation and Discharge Note   Patient Details Name: Clinton Roach MRN: [id number] DOB: 10-24-1990   Cancelled Treatment:    Reason Eval/Treat Not Completed: PT screened, no needs identified, will sign off.  Spoke with pt, who is ambulatory with no problems.  Mild limitation in elbow ROM through flex/ext and pron/sup.  Note order for sling.  Pt educated to wear sling for comfort and for limited time, to remove and perform gentle A/ROM in all affected planes through painfree ROM.    Recommend OPPT or OPOT for elbow rehab once cleared by MD in order to facilitate full return of function following injury.  No PT needs acutely.    Narda Amber [facility] 04/09/2015, 12:28 PM

## 2015-05-19 ENCOUNTER — Emergency Department (HOSPITAL_COMMUNITY): Payer: 59

## 2015-05-19 ENCOUNTER — Emergency Department (HOSPITAL_COMMUNITY)
Admission: EM | Admit: 2015-05-19 | Discharge: 2015-05-19 | Disposition: A | Discharge status: Home / Self Care | Payer: 59 | Attending: Emergency Medicine | Admitting: Emergency Medicine

## 2015-05-19 ENCOUNTER — Encounter (HOSPITAL_COMMUNITY): Payer: Self-pay | Admitting: Emergency Medicine

## 2015-05-19 DIAGNOSIS — Y9389 Activity, other specified: Secondary | ICD-10-CM | POA: Insufficient documentation

## 2015-05-19 DIAGNOSIS — S80812A Abrasion, left lower leg, initial encounter: Secondary | ICD-10-CM | POA: Insufficient documentation

## 2015-05-19 DIAGNOSIS — Z720 Tobacco use: Secondary | ICD-10-CM | POA: Insufficient documentation

## 2015-05-19 DIAGNOSIS — Y998 Other external cause status: Secondary | ICD-10-CM | POA: Insufficient documentation

## 2015-05-19 DIAGNOSIS — Y92410 Unspecified street and highway as the place of occurrence of the external cause: Secondary | ICD-10-CM | POA: Insufficient documentation

## 2015-05-19 MED ORDER — FENTANYL CITRATE (PF) 100 MCG/2ML IJ SOLN
100.0000 ug | Freq: Once | INTRAMUSCULAR | Status: AC
  Administered 2015-05-19: 100 ug via NASAL
  Filled 2015-05-19: qty 2

## 2015-05-19 MED ORDER — FENTANYL CITRATE (PF) 100 MCG/2ML IJ SOLN: 100.0000 ug | Freq: Once | INTRAMUSCULAR | Status: DC

## 2015-05-19 MED ORDER — TETANUS-DIPHTH-ACELL PERTUSSIS 5-2.5-18.5 LF-MCG/0.5 IM SUSP: 0.5000 mL | Freq: Once | INTRAMUSCULAR | Status: DC
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
## 2015-05-19 NOTE — ED Notes (Signed)
Patient states a family should be on their way to pick him up

## 2015-05-19 NOTE — ED Notes (Signed)
Pt was able to ambulate without assistance with a steady gait.  

## 2015-05-19 NOTE — ED Notes (Signed)
Pt is trying to call his father to come pick him up
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
## 2018-07-08 ENCOUNTER — Emergency Department (HOSPITAL_COMMUNITY)
Admission: EM | Admit: 2018-07-08 | Discharge: 2018-07-08 | Disposition: A | Discharge status: Home / Self Care | Payer: 59 | Attending: Emergency Medicine | Admitting: Emergency Medicine

## 2018-07-08 ENCOUNTER — Encounter (HOSPITAL_COMMUNITY): Payer: Self-pay

## 2018-07-08 ENCOUNTER — Other Ambulatory Visit: Payer: Self-pay

## 2018-07-08 DIAGNOSIS — R591 Generalized enlarged lymph nodes: Secondary | ICD-10-CM

## 2018-07-08 DIAGNOSIS — F17210 Nicotine dependence, cigarettes, uncomplicated: Secondary | ICD-10-CM | POA: Insufficient documentation

## 2018-07-08 LAB — CBC WITH DIFFERENTIAL/PLATELET
ABS IMMATURE GRANULOCYTES: 0.01 10*3/uL (ref 0.00–0.07)
BASOS ABS: 0 10*3/uL (ref 0.0–0.1)
Basophils Relative: 0 %
EOS PCT: 8 %
Eosinophils Absolute: 0.3 10*3/uL (ref 0.0–0.5)
HEMATOCRIT: 44.8 % (ref 39.0–52.0)
HEMOGLOBIN: 14.5 g/dL (ref 13.0–17.0)
Immature Granulocytes: 0 %
LYMPHS ABS: 1.5 10*3/uL (ref 0.7–4.0)
LYMPHS PCT: 38 %
MCH: 29.6 pg (ref 26.0–34.0)
MCHC: 32.4 g/dL (ref 30.0–36.0)
MCV: 91.4 fL (ref 80.0–100.0)
Monocytes Absolute: 0.5 10*3/uL (ref 0.1–1.0)
Monocytes Relative: 12 %
NEUTROS ABS: 1.6 10*3/uL — AB (ref 1.7–7.7)
Neutrophils Relative %: 42 %
Platelets: 180 10*3/uL (ref 150–400)
RBC: 4.9 MIL/uL (ref 4.22–5.81)
RDW: 12.4 % (ref 11.5–15.5)
WBC: 3.9 10*3/uL — ABNORMAL LOW (ref 4.0–10.5)
nRBC: 0 % (ref 0.0–0.2)

## 2018-07-08 NOTE — Discharge Instructions (Addendum)
You were evaluated today for lymphadenopathy.  Your lab work was normal in the department.  You will need to follow-up with Dr. Lazarus SalinesWolicki, ENT specialist.  Please call the office tomorrow to schedule an appointment.  Return to the ED for any new or worsening symptoms.

## 2018-07-08 NOTE — ED Triage Notes (Signed)
Pt states that he has noticed swelling in his bilateral lymph nodes for 2-3 months. Pt states that he has also felt some swelling in his throat. Able to speak in full sentences without complication.

## 2018-07-08 NOTE — ED Provider Notes (Signed)
[facility]-EMERGENCY DEPT Provider Note   CSN: [id number] Arrival date & time: 07/08/18  1930  History   Chief Complaint Chief Complaint  Patient presents with  . Lymphadenopathy    HPI Clinton Roach is a 27 y.o. male with no significant past medical history who presents for evaluation of lymphadenopathy.  Patient states he noticed swelling to his bilateral lymph nodes along his face and neck x3 months.  Patient states that they have been increasing in size.  Patient denies recent weight loss.  He does admit to chronic fatigue. Denies fever, chills, nausea, vomiting, headaches, sore throat, cough, difficulty swallowing, vision changes, neck pain or neck stiffness, abdominal pain.  Denies aggravating or alleviating factors.  Patient states that his lymph nodes are nontender.  She obtained from patient.  No interpreter was used. HPI  Past Medical History:  Diagnosis Date  . Medical history non-contributory     Patient Active Problem List   Diagnosis Date Noted  . Cellulitis of elbow 04/07/2015  . Olecranon bursitis of left elbow 04/07/2015  . 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 GANGLION CYST EXCISION Left 05/06/2008   resection dorsal cyst left wrist  . GANGLION CYST EXCISION Left 12/17/2012   Procedure: EXCISION MASS LEFT WRIST;  Surgeon: Wyn Forster., MD;  Location: [facility];  Service: Orthopedics;  Laterality: Left;        Home Medications    Prior to Admission medications   Not on File    Family History No family history on file.  Social History Social History   Tobacco Use  . Smoking status: Current Some Day Smoker  . Smokeless tobacco: Never Used  Substance Use Topics  . Alcohol use: Yes    Alcohol/week: 12.0 standard drinks    Types: 12 Cans of beer per week  . Drug use: No     Allergies   Peanuts [peanut oil] and Shellfish  allergy   Review of Systems Review of Systems  Constitutional: Positive for fatigue. Negative for activity change, appetite change, chills, diaphoresis, fever and unexpected weight change.  HENT: Negative.        Swollen lymph nodes.  Respiratory: Negative.   Cardiovascular: Negative.   Gastrointestinal: Negative.   Genitourinary: Negative.   Musculoskeletal: Negative.   Skin: Negative.   Neurological: Negative.      Physical Exam Updated Vital Signs BP (!) 142/91 (BP Location: Left Arm)   Pulse 60   Temp 98.2 F (36.8 C) (Oral)   Resp 18   Ht 5\' 11"  (1.803 m)   Wt 72.6 kg   SpO2 100%   BMI 22.32 kg/m   Physical Exam  Constitutional: Vital signs are normal. He appears well-developed and well-nourished.  Non-toxic appearance. He does not have a sickly appearance. He does not appear ill. No distress.  HENT:  Head: Normocephalic and atraumatic.  Right Ear: Tympanic membrane, external ear and ear canal normal. No drainage, swelling or tenderness. Tympanic membrane is not scarred, not perforated, not erythematous, not retracted and not bulging.  Left Ear: Tympanic membrane, external ear and ear canal normal. No drainage, swelling or tenderness. Tympanic membrane is not scarred, not perforated, not erythematous, not retracted and not bulging.  Nose: Nose normal. No mucosal edema, rhinorrhea or sinus tenderness. Right sinus exhibits no maxillary sinus tenderness and no frontal sinus tenderness. Left sinus exhibits no maxillary sinus tenderness and no frontal  sinus tenderness.  Mouth/Throat: Uvula is midline, oropharynx is clear and moist and mucous membranes are normal. No oral lesions. No trismus in the jaw. No dental abscesses, uvula swelling, lacerations or dental caries. No oropharyngeal exudate, posterior oropharyngeal edema, posterior oropharyngeal erythema or tonsillar abscesses. No tonsillar exudate.  Eyes: Pupils are equal, round, and reactive to light.  Neck: Normal range  of motion, full passive range of motion without pain and phonation normal. Neck supple. No neck rigidity. No edema, no erythema and normal range of motion present. No thyromegaly present.  Cardiovascular: Normal rate, regular rhythm, normal heart sounds, intact distal pulses and normal pulses. Exam reveals no gallop and no friction rub.  No murmur heard. Pulmonary/Chest: Effort normal and breath sounds normal. No accessory muscle usage or stridor. No tachypnea. No respiratory distress. He has no decreased breath sounds. He has no wheezes. He has no rhonchi. He has no rales. He exhibits no tenderness.  Abdominal: Soft. Normal appearance. He exhibits no distension, no fluid wave and no mass. There is no splenomegaly. There is no tenderness. There is no rigidity, no rebound and no guarding.  Musculoskeletal: Normal range of motion.  Lymphadenopathy:       Head (right side): Submental, submandibular, tonsillar and posterior auricular adenopathy present. No preauricular and no occipital adenopathy present.       Head (left side): Submental, submandibular, tonsillar and posterior auricular adenopathy present. No preauricular and no occipital adenopathy present.    He has cervical adenopathy.       Right cervical: Superficial cervical and posterior cervical adenopathy present.       Left cervical: Superficial cervical and posterior cervical adenopathy present.    He has no axillary adenopathy.       Right: No inguinal and no supraclavicular adenopathy present.       Left: No inguinal and no supraclavicular adenopathy present.  Neurological: He is alert. He has normal strength. He displays no atrophy and no tremor. No sensory deficit. He exhibits normal muscle tone.  Skin: Skin is warm and dry. No bruising, no ecchymosis, no lesion and no rash noted. He is not diaphoretic. No erythema.  No edema, erythema, ecchymosis or warmth.  Psychiatric: He has a normal mood and affect.  Nursing note and vitals  reviewed.    ED Treatments / Results  Labs (all labs ordered are listed, but only abnormal results are displayed) Labs Reviewed  CBC WITH DIFFERENTIAL/PLATELET - Abnormal; Notable for the following components:      Result Value   WBC 3.9 (*)    Neutro Abs 1.6 (*)    All other components within normal limits    EKG None  Radiology No results found.  Procedures Procedures (including critical care time)  Medications Ordered in ED Medications - No data to display   Initial Impression / Assessment and Plan / ED Course  I have reviewed the triage vital signs and the nursing notes.  Pertinent labs & imaging results that were available during my care of the patient were reviewed by me and considered in my medical decision making (see chart for details).  27 year old male who appears otherwise well presents for evaluation of lymphadenopathy.  Afebrile, nonseptic, non-ill-appearing.  Patient with diffuse facial lymphadenopathy, most prominent to submental, submandibular as well as anterior and posterior cervical nodes.  Patient with fatigue.  Denies history of previous malignancy.  Denies family history of lymphoma.  States these lymph nodes have been present x3 months and have been gradually increasing  in size.  Denies sore throat, neck pain, neck stiffness, recent weight loss.  Heart regular rate and rhythm, lungs clear to auscultation bilaterally.  Full range of motion neck without stiffness or rigidity.  Posterior oropharynx without erythema, edema or exudate.  Tonsils without edema, erythema or exudate.  Abdomen nontender without splenomegaly.  No rebound or guarding.  Will consult with ENT for treatment plan.  Discussed with Dr. Lazarus SalinesWolicki, ENT.  He recommends CBC.  Concern for Hodgkin's lymphoma given length of time and prominence of nodes.  He recommends close follow-up with ENT in office.  CBC with WBC at 3.9.  Discussed with patient close follow-up with ENT.  Discussed strict  return precautions. Patient is hemodynamically stable and appropriate for DC home at this time.  Patient voiced understanding of return precautions and is agreeable for follow-up.  Patient was discussed with my attending, Dr. Rubin PayorPickering who agrees with above treatment, plan and disposition.    Final Clinical Impressions(s) / ED Diagnoses   Final diagnoses:  Lymphadenopathy    ED Discharge Orders    None       Henderly, Britni A, PA-C 07/08/18 2159    Benjiman CorePickering, Nathan, MD 07/08/18 2329

## 2018-07-22 ENCOUNTER — Other Ambulatory Visit: Payer: Self-pay

## 2018-07-22 ENCOUNTER — Encounter (HOSPITAL_BASED_OUTPATIENT_CLINIC_OR_DEPARTMENT_OTHER): Payer: Self-pay

## 2018-07-28 NOTE — H&P (Signed)
HPI:   Clinton Roach is a 27 y.o. male who presents as a new Patient.   Referring Provider: Self, A Referral  Chief complaint: Swollen lymph nodes.  HPI: About 2 months ago he developed swollen lymph nodes on both sides of his neck. Is worse on the right. He has no other symptoms. He denies any fever, chills other than one episode the other night, loss of appetite, weight loss, sore throat, trouble breathing or swallowing. He does feel like his airway closes up on him sometimes in the middle of the night. He does smoke and drink alcohol. He denies any lumps or bumps in his groin or armpits. Blood count was reportedly normal.  PMH/Meds/All/SocHx/FamHx/ROS:   History reviewed. No pertinent past medical history.  History reviewed. No pertinent surgical history.  No family history of bleeding disorders, wound healing problems or difficulty with anesthesia.   Social History   Socioeconomic History  . Marital status: Single  Spouse name: Not on file  . Number of children: Not on file  . Years of education: Not on file  . Highest education level: Not on file  Occupational History  . Not on file  Social Needs  . Financial resource strain: Not on file  . Food insecurity:  Worry: Not on file  Inability: Not on file  . Transportation needs:  Medical: Not on file  Non-medical: Not on file  Tobacco Use  . Smoking status: Current Every Day Smoker  Packs/day: 0.00  . Smokeless tobacco: Never Used  Substance and Sexual Activity  . Alcohol use: Not on file  . Drug use: Not on file  . Sexual activity: Not on file  Lifestyle  . Physical activity:  Days per week: Not on file  Minutes per session: Not on file  . Stress: Not on file  Relationships  . Social connections:  Talks on phone: Not on file  Gets together: Not on file  Attends religious service: Not on file  Active member of club or organization: Not on file  Attends meetings of clubs or organizations: Not on file   Relationship status: Not on file  Other Topics Concern  . Not on file  Social History Narrative  . Not on file   No current outpatient medications on file.  A complete ROS was performed with pertinent positives/negatives noted in the HPI. The remainder of the ROS are negative.   Physical Exam:   BP 113/71 (Site: Left arm, Position: Sitting)  Ht 1.803 m (5\' 11" )  Wt 72.6 kg (160 lb)  BMI 22.32 kg/m   General: Healthy and alert, in no distress, breathing easily. Normal affect. In a pleasant mood. Head: Normocephalic, atraumatic. No masses, or scars. Eyes: Pupils are equal, and reactive to light. Vision is grossly intact. No spontaneous or gaze nystagmus. Ears: Ear canals are clear. Tympanic membranes are intact, with normal landmarks and the middle ears are clear and healthy. Hearing: Grossly normal. Nose: Nasal cavities are clear with healthy mucosa, no polyps or exudate. Airways are patent. Face: No masses or scars, facial nerve function is symmetric. Oral Cavity: No mucosal abnormalities are noted. Tongue with normal mobility. Dentition appears healthy. Oropharynx: Tonsils are symmetric. There are no mucosal masses identified. Tongue base appears normal and healthy. Larynx/Hypopharynx: indirect exam reveals healthy, mobile vocal cords, without mucosal lesions in the hypopharynx or larynx. Chest: Deferred Neck: Bilateral cervical lymphadenopathy. The largest is a submental node. There are bilateral level 2 nodes, right side level 5, right side level  3. No thyroid nodules or enlargement. Neuro: Cranial nerves II-XII with normal function. Balance: Normal gate. Other findings: none.  Independent Review of Additional Tests or Records:  none  Procedures:  Procedure Note:  Indications for procedure: neck mass  Details of the procedure were discussed with the patient and all questions were answered.  Procedure:  2% xylocaine with epinephrine was infiltrated into the  overlying skin. First pass was made with a 25 gauge needle and 10 cc syringe. Second pass was made with a 22 gauge needle. Specimen was placed on microscopic slides and air-dried. Additional material was placed in Cytolyte solution for cell block preparation. A third pass was made with a 22 guage needle and sample was added to the cytolyte solution.  A bandage was applied.   He tolerated the procedure well. Results will be discussed when available.  Impression & Plans:  Cervical lymphadenopathy. Concerning for either infection, malignancy or lymphoma. FNA performed today. We will discuss results when available. Recommend he try to stop smoking.

## 2018-07-29 ENCOUNTER — Encounter (HOSPITAL_BASED_OUTPATIENT_CLINIC_OR_DEPARTMENT_OTHER): Payer: Self-pay | Admitting: Anesthesiology

## 2018-07-29 ENCOUNTER — Ambulatory Visit (HOSPITAL_BASED_OUTPATIENT_CLINIC_OR_DEPARTMENT_OTHER): Payer: Self-pay | Admitting: Anesthesiology

## 2018-07-29 ENCOUNTER — Encounter (HOSPITAL_BASED_OUTPATIENT_CLINIC_OR_DEPARTMENT_OTHER)
Admission: RE | Disposition: A | Discharge status: Home / Self Care | Payer: Self-pay | Source: Home / Self Care | Attending: Otolaryngology

## 2018-07-29 ENCOUNTER — Ambulatory Visit (HOSPITAL_BASED_OUTPATIENT_CLINIC_OR_DEPARTMENT_OTHER)
Admission: RE | Admit: 2018-07-29 | Discharge: 2018-07-29 | Disposition: A | Discharge status: Home / Self Care | Payer: Self-pay | Attending: Otolaryngology | Admitting: Otolaryngology

## 2018-07-29 DIAGNOSIS — R590 Localized enlarged lymph nodes: Secondary | ICD-10-CM | POA: Insufficient documentation

## 2018-07-29 HISTORY — PX: LYMPH NODE BIOPSY: SHX201

## 2018-07-29 SURGERY — LYMPH NODE BIOPSY
Anesthesia: General | Site: Neck

## 2018-07-29 MED ORDER — LIDOCAINE-EPINEPHRINE 1 %-1:100000 IJ SOLN
INTRAMUSCULAR | Status: AC
  Filled 2018-07-29: qty 1

## 2018-07-29 MED ORDER — LIDOCAINE HCL (CARDIAC) PF 100 MG/5ML IV SOSY
PREFILLED_SYRINGE | INTRAVENOUS | Status: DC | PRN
  Administered 2018-07-29: 80 mg via INTRAVENOUS

## 2018-07-29 MED ORDER — ONDANSETRON HCL 4 MG/2ML IJ SOLN
INTRAMUSCULAR | Status: AC
  Filled 2018-07-29: qty 2

## 2018-07-29 MED ORDER — MIDAZOLAM HCL 2 MG/2ML IJ SOLN
INTRAMUSCULAR | Status: AC
  Filled 2018-07-29: qty 2

## 2018-07-29 MED ORDER — LIDOCAINE-EPINEPHRINE 1 %-1:100000 IJ SOLN
INTRAMUSCULAR | Status: DC | PRN
  Administered 2018-07-29: 1 mL

## 2018-07-29 MED ORDER — LACTATED RINGERS IV SOLN
INTRAVENOUS | Status: DC | PRN
  Administered 2018-07-29: 10:00:00 via INTRAVENOUS

## 2018-07-29 MED ORDER — PROPOFOL 500 MG/50ML IV EMUL
INTRAVENOUS | Status: AC
  Filled 2018-07-29: qty 50

## 2018-07-29 MED ORDER — OXYMETAZOLINE HCL 0.05 % NA SOLN
NASAL | Status: AC
  Filled 2018-07-29: qty 15

## 2018-07-29 MED ORDER — DEXAMETHASONE SODIUM PHOSPHATE 4 MG/ML IJ SOLN
INTRAMUSCULAR | Status: DC | PRN
  Administered 2018-07-29: 10 mg via INTRAVENOUS

## 2018-07-29 MED ORDER — MIDAZOLAM HCL 5 MG/5ML IJ SOLN
INTRAMUSCULAR | Status: DC | PRN
  Administered 2018-07-29: 2 mg via INTRAVENOUS

## 2018-07-29 MED ORDER — FENTANYL CITRATE (PF) 100 MCG/2ML IJ SOLN
INTRAMUSCULAR | Status: DC | PRN
  Administered 2018-07-29: 100 ug via INTRAVENOUS

## 2018-07-29 MED ORDER — ONDANSETRON HCL 4 MG/2ML IJ SOLN
INTRAMUSCULAR | Status: DC | PRN
  Administered 2018-07-29: 4 mg via INTRAVENOUS

## 2018-07-29 MED ORDER — LACTATED RINGERS IV SOLN: INTRAVENOUS | Status: DC

## 2018-07-29 MED ORDER — HYDROCODONE-ACETAMINOPHEN 7.5-325 MG PO TABS: 1.0000 | ORAL_TABLET | Freq: Four times a day (QID) | ORAL | 0 refills | Status: AC | PRN

## 2018-07-29 MED ORDER — MIDAZOLAM HCL 2 MG/2ML IJ SOLN: 1.0000 mg | INTRAMUSCULAR | Status: DC | PRN

## 2018-07-29 MED ORDER — FENTANYL CITRATE (PF) 100 MCG/2ML IJ SOLN: 50.0000 ug | INTRAMUSCULAR | Status: DC | PRN

## 2018-07-29 MED ORDER — FENTANYL CITRATE (PF) 100 MCG/2ML IJ SOLN
INTRAMUSCULAR | Status: AC
  Filled 2018-07-29: qty 2

## 2018-07-29 MED ORDER — LIDOCAINE 2% (20 MG/ML) 5 ML SYRINGE
INTRAMUSCULAR | Status: AC
  Filled 2018-07-29: qty 5

## 2018-07-29 MED ORDER — PROPOFOL 10 MG/ML IV BOLUS
INTRAVENOUS | Status: DC | PRN
  Administered 2018-07-29: 200 mg via INTRAVENOUS

## 2018-07-29 MED ORDER — DEXAMETHASONE SODIUM PHOSPHATE 10 MG/ML IJ SOLN
INTRAMUSCULAR | Status: AC
  Filled 2018-07-29: qty 1

## 2018-07-29 MED ORDER — PROMETHAZINE HCL 25 MG RE SUPP: 25.0000 mg | Freq: Four times a day (QID) | RECTAL | 1 refills | Status: AC | PRN

## 2018-07-29 MED ORDER — SCOPOLAMINE 1 MG/3DAYS TD PT72: 1.0000 | MEDICATED_PATCH | Freq: Once | TRANSDERMAL | Status: DC | PRN

## 2018-07-29 SURGICAL SUPPLY — 57 items
ATTRACTOMAT 16X20 MAGNETIC DRP (DRAPES) IMPLANT
BENZOIN TINCTURE PRP APPL 2/3 (GAUZE/BANDAGES/DRESSINGS) IMPLANT
BLADE SURG 15 STRL LF DISP TIS (BLADE) ×1 IMPLANT
BLADE SURG 15 STRL SS (BLADE) ×2
CANISTER SUCT 1200ML W/VALVE (MISCELLANEOUS) ×3 IMPLANT
CLEANER CAUTERY TIP 5X5 PAD (MISCELLANEOUS) ×1 IMPLANT
CLIP VESOCCLUDE MED 6/CT (CLIP) IMPLANT
CLIP VESOCCLUDE SM WIDE 6/CT (CLIP) IMPLANT
CLOSURE WOUND 1/2 X4 (GAUZE/BANDAGES/DRESSINGS)
CORD BIPOLAR FORCEPS 12FT (ELECTRODE) IMPLANT
COVER BACK TABLE 60X90IN (DRAPES) ×3 IMPLANT
COVER MAYO STAND STRL (DRAPES) ×3 IMPLANT
COVER WAND RF STERILE (DRAPES) IMPLANT
DERMABOND ADVANCED (GAUZE/BANDAGES/DRESSINGS)
DERMABOND ADVANCED .7 DNX12 (GAUZE/BANDAGES/DRESSINGS) IMPLANT
DRAIN JACKSON RD 7FR 3/32 (WOUND CARE) IMPLANT
DRAIN PENROSE 1/4X12 LTX STRL (WOUND CARE) IMPLANT
DRAPE U-SHAPE 76X120 STRL (DRAPES) ×3 IMPLANT
ELECT COATED BLADE 2.86 ST (ELECTRODE) ×3 IMPLANT
ELECT REM PT RETURN 9FT ADLT (ELECTROSURGICAL) ×3
ELECTRODE REM PT RTRN 9FT ADLT (ELECTROSURGICAL) ×1 IMPLANT
EVACUATOR SILICONE 100CC (DRAIN) IMPLANT
GAUZE 4X4 16PLY RFD (DISPOSABLE) IMPLANT
GAUZE SPONGE 4X4 12PLY STRL LF (GAUZE/BANDAGES/DRESSINGS) IMPLANT
GLOVE BIO SURGEON STRL SZ7 (GLOVE) ×3 IMPLANT
GLOVE BIOGEL PI IND STRL 7.5 (GLOVE) ×2 IMPLANT
GLOVE BIOGEL PI INDICATOR 7.5 (GLOVE) ×4
GLOVE ECLIPSE 7.5 STRL STRAW (GLOVE) ×3 IMPLANT
GOWN STRL REUS W/ TWL LRG LVL3 (GOWN DISPOSABLE) ×1 IMPLANT
GOWN STRL REUS W/ TWL XL LVL3 (GOWN DISPOSABLE) ×1 IMPLANT
GOWN STRL REUS W/TWL LRG LVL3 (GOWN DISPOSABLE) ×2
GOWN STRL REUS W/TWL XL LVL3 (GOWN DISPOSABLE) ×2
NEEDLE PRECISIONGLIDE 27X1.5 (NEEDLE) IMPLANT
NS IRRIG 1000ML POUR BTL (IV SOLUTION) IMPLANT
PACK BASIN DAY SURGERY FS (CUSTOM PROCEDURE TRAY) ×3 IMPLANT
PAD CLEANER CAUTERY TIP 5X5 (MISCELLANEOUS) ×2
PENCIL FOOT CONTROL (ELECTRODE) ×3 IMPLANT
RUBBERBAND STERILE (MISCELLANEOUS) IMPLANT
SPONGE GAUZE 2X2 8PLY STER LF (GAUZE/BANDAGES/DRESSINGS)
SPONGE GAUZE 2X2 8PLY STRL LF (GAUZE/BANDAGES/DRESSINGS) IMPLANT
STRIP CLOSURE SKIN 1/2X4 (GAUZE/BANDAGES/DRESSINGS) IMPLANT
SUCTION FRAZIER HANDLE 10FR (MISCELLANEOUS)
SUCTION TUBE FRAZIER 10FR DISP (MISCELLANEOUS) IMPLANT
SUT CHROMIC 3 0 PS 2 (SUTURE) IMPLANT
SUT CHROMIC 4 0 P 3 18 (SUTURE) IMPLANT
SUT ETHILON 4 0 PS 2 18 (SUTURE) IMPLANT
SUT ETHILON 5 0 P 3 18 (SUTURE)
SUT NYLON ETHILON 5-0 P-3 1X18 (SUTURE) IMPLANT
SUT PLAIN 5 0 P 3 18 (SUTURE) IMPLANT
SUT SILK 4 0 TIES 17X18 (SUTURE) IMPLANT
SUT VICRYL 4-0 PS2 18IN ABS (SUTURE) IMPLANT
SYR BULB 3OZ (MISCELLANEOUS) IMPLANT
SYR CONTROL 10ML LL (SYRINGE) ×3 IMPLANT
TOWEL GREEN STERILE FF (TOWEL DISPOSABLE) ×3 IMPLANT
TRAY DSU PREP LF (CUSTOM PROCEDURE TRAY) ×3 IMPLANT
TUBE CONNECTING 20'X1/4 (TUBING) ×1
TUBE CONNECTING 20X1/4 (TUBING) ×2 IMPLANT

## 2018-07-29 NOTE — Anesthesia Preprocedure Evaluation (Addendum)
Anesthesia Evaluation  Patient identified by MRN, date of birth, ID band Patient awake    Reviewed: Allergy & Precautions, NPO status , Patient's Chart, lab work & pertinent test results  Airway Mallampati: I  TM Distance: >3 FB Neck ROM: Full    Dental no notable dental hx. (+) Dental Advisory Given,    Pulmonary neg pulmonary ROS, former smoker,    Pulmonary exam normal breath sounds clear to auscultation       Cardiovascular negative cardio ROS Normal cardiovascular exam Rhythm:Regular Rate:Normal     Neuro/Psych negative neurological ROS  negative psych ROS   GI/Hepatic negative GI ROS, Neg liver ROS,   Endo/Other  negative endocrine ROS  Renal/GU negative Renal ROS  negative genitourinary   Musculoskeletal negative musculoskeletal ROS (+)   Abdominal   Peds  Hematology negative hematology ROS (+)   Anesthesia Other Findings Cervical lymphadenopathy  Reproductive/Obstetrics                           Anesthesia Physical Anesthesia Plan  ASA: I  Anesthesia Plan: General   Post-op Pain Management:    Induction: Intravenous  PONV Risk Score and Plan: 2 and Ondansetron, Dexamethasone and Midazolam  Airway Management Planned: LMA  Additional Equipment:   Intra-op Plan:   Post-operative Plan: Extubation in OR  Informed Consent: I have reviewed the patients History and Physical, chart, labs and discussed the procedure including the risks, benefits and alternatives for the proposed anesthesia with the patient or authorized representative who has indicated his/her understanding and acceptance.   Dental advisory given  Plan Discussed with: CRNA  Anesthesia Plan Comments:         Anesthesia Quick Evaluation

## 2018-07-29 NOTE — Discharge Instructions (Signed)
You may shower and use soap and water. Do not use any creams, oils or ointment. ° °Post Anesthesia Home Care Instructions ° °Activity: °Get plenty of rest for the remainder of the day. A responsible individual must stay with you for 24 hours following the procedure.  °For the next 24 hours, DO NOT: °-Drive a car °-Operate machinery °-Drink alcoholic beverages °-Take any medication unless instructed by your physician °-Make any legal decisions or sign important papers. ° °Meals: °Start with liquid foods such as gelatin or soup. Progress to regular foods as tolerated. Avoid greasy, spicy, heavy foods. If nausea and/or vomiting occur, drink only clear liquids until the nausea and/or vomiting subsides. Call your physician if vomiting continues. ° °Special Instructions/Symptoms: °Your throat may feel dry or sore from the anesthesia or the breathing tube placed in your throat during surgery. If this causes discomfort, gargle with warm salt water. The discomfort should disappear within 24 hours. ° °If you had a scopolamine patch placed behind your ear for the management of post- operative nausea and/or vomiting: ° °1. The medication in the patch is effective for 72 hours, after which it should be removed.  Wrap patch in a tissue and discard in the trash. Wash hands thoroughly with soap and water. °2. You may remove the patch earlier than 72 hours if you experience unpleasant side effects which may include dry mouth, dizziness or visual disturbances. °3. Avoid touching the patch. Wash your hands with soap and water after contact with the patch. °   ° °

## 2018-07-29 NOTE — Interval H&P Note (Signed)
History and Physical Interval Note:  07/29/2018 9:36 AM  Clinton BlowKenneth W Jhaveri  has presented today for surgery, with the diagnosis of Cervical Lymphadenopathy  The various methods of treatment have been discussed with the patient and family. After consideration of risks, benefits and other options for treatment, the patient has consented to  Procedure(s): LYMPH NODE BIOPSY (N/A) as a surgical intervention .  The patient's history has been reviewed, patient examined, no change in status, stable for surgery.  I have reviewed the patient's chart and labs.  Questions were answered to the patient's satisfaction.     Serena ColonelJefry Lindi Abram

## 2018-07-29 NOTE — Op Note (Signed)
OPERATIVE REPORT  DATE OF SURGERY: 07/29/2018  PATIENT:  Clinton Roach,  27 y.o. male  PRE-OPERATIVE DIAGNOSIS:  Cervical Lymphadenopathy  POST-OPERATIVE DIAGNOSIS:  Cervical Lymphadenopathy  PROCEDURE:  Procedure(s): LYMPH NODE BIOPSY ANTERIOR NECK  SURGEON:  Susy FrizzleJefry H Agam Davenport, MD  ASSISTANTS: None  ANESTHESIA:   General   EBL: 10 ml  DRAINS: None  LOCAL MEDICATIONS USED: 1% Xylocaine with epinephrine  SPECIMEN: Anterior cervical lymph nodes for lymphoma work-up  COUNTS:  Correct  PROCEDURE DETAILS: The patient was taken to the operating room and placed on the operating table in the supine position. Following induction of general endotracheal anesthesia, the neck was prepped and draped in a standard fashion.  The lymph nodes in the anterior midline were identified and a marking pen was used to mark the proposed incision just at the lower aspect of the beard line and a transverse skin crease.  Local anesthetic was infiltrated.  15 scalpel was used to incise the skin and subcutaneous tissue.  Electrocautery was used for hemostasis and to complete dissection down through the platysma layer.  Platysma was elevated superiorly and the lymph nodes were identified.  Lymph nodes were dissected free of surrounding tissue.  Cautery was used for hemostasis.  2 large lymph nodes were sent fresh for pathologic evaluation.  The wound was irrigated with saline and hemostasis was completed.  The platysmal layer was reapproximated with interrupted 3-0 chromic.  A 3-0 chromic running subcuticular closure was accomplished and Dermabond was used on the skin.  The patient was awakened extubated and transferred to recovery in stable condition.    PATIENT DISPOSITION:  To PACU, stable

## 2018-07-29 NOTE — Transfer of Care (Signed)
Immediate Anesthesia Transfer of Care Note  Patient: Clinton BlowKenneth W Roach  Procedure(s) Performed: LYMPH NODE BIOPSY ANTERIOR NECK (N/A Neck)  Patient Location: PACU  Anesthesia Type:General  Level of Consciousness: awake, sedated and patient cooperative  Airway & Oxygen Therapy: Patient Spontanous Breathing and Patient connected to face mask oxygen  Post-op Assessment: Report given to RN and Post -op Vital signs reviewed and stable  Post vital signs: Reviewed and stable  Last Vitals:  Vitals Value Taken Time  BP    Temp    Pulse    Resp    SpO2      Last Pain:  Vitals:   07/29/18 0927  TempSrc: Oral  PainSc: 0-No pain         Complications: No apparent anesthesia complications

## 2018-07-29 NOTE — Anesthesia Postprocedure Evaluation (Signed)
Anesthesia Post Note  Patient: Clinton Roach  Procedure(s) Performed: LYMPH NODE BIOPSY ANTERIOR NECK (N/A Neck)     Patient location during evaluation: PACU Anesthesia Type: General Level of consciousness: awake and alert Pain management: pain level controlled Vital Signs Assessment: post-procedure vital signs reviewed and stable Respiratory status: spontaneous breathing, nonlabored ventilation, respiratory function stable and patient connected to nasal cannula oxygen Cardiovascular status: blood pressure returned to baseline and stable Postop Assessment: no apparent nausea or vomiting Anesthetic complications: no    Last Vitals:  Vitals:   07/29/18 1057 07/29/18 1115  BP: 127/83 (!) 123/94  Pulse: 70 64  Resp: 15 18  Temp:  36.9 C  SpO2: 98% 99%    Last Pain:  Vitals:   07/29/18 1115  TempSrc:   PainSc: 0-No pain                 Chelsey L Woodrum

## 2018-07-29 NOTE — Anesthesia Procedure Notes (Signed)
Procedure Name: LMA Insertion Date/Time: 07/29/2018 9:49 AM Performed by: Gar GibbonKeeton, Froilan Mclean S, CRNA Pre-anesthesia Checklist: Patient identified, Emergency Drugs available, Suction available and Patient being monitored Patient Re-evaluated:Patient Re-evaluated prior to induction Oxygen Delivery Method: Circle system utilized Preoxygenation: Pre-oxygenation with 100% oxygen Induction Type: IV induction Ventilation: Mask ventilation without difficulty LMA: LMA inserted LMA Size: 5.0 Number of attempts: 1 Airway Equipment and Method: Bite block Placement Confirmation: positive ETCO2 Tube secured with: Tape Dental Injury: Teeth and Oropharynx as per pre-operative assessment

## 2018-07-30 ENCOUNTER — Encounter (HOSPITAL_BASED_OUTPATIENT_CLINIC_OR_DEPARTMENT_OTHER): Payer: Self-pay | Admitting: Otolaryngology

## 2019-06-28 ENCOUNTER — Emergency Department (HOSPITAL_COMMUNITY)
Admission: EM | Admit: 2019-06-28 | Discharge: 2019-06-28 | Disposition: A | Discharge status: Home / Self Care | Payer: BC Managed Care – PPO | Attending: Emergency Medicine | Admitting: Emergency Medicine

## 2019-06-28 ENCOUNTER — Encounter (HOSPITAL_COMMUNITY): Payer: Self-pay | Admitting: Student

## 2019-06-28 DIAGNOSIS — K0889 Other specified disorders of teeth and supporting structures: Secondary | ICD-10-CM | POA: Diagnosis present

## 2019-06-28 DIAGNOSIS — F1290 Cannabis use, unspecified, uncomplicated: Secondary | ICD-10-CM | POA: Diagnosis not present

## 2019-06-28 DIAGNOSIS — Z87891 Personal history of nicotine dependence: Secondary | ICD-10-CM | POA: Insufficient documentation

## 2019-06-28 DIAGNOSIS — K029 Dental caries, unspecified: Secondary | ICD-10-CM | POA: Insufficient documentation

## 2019-06-28 DIAGNOSIS — R030 Elevated blood-pressure reading, without diagnosis of hypertension: Secondary | ICD-10-CM | POA: Insufficient documentation

## 2019-06-28 MED ORDER — NAPROXEN 500 MG PO TABS: 500.0000 mg | ORAL_TABLET | Freq: Two times a day (BID) | ORAL | 0 refills | Status: AC

## 2019-06-28 MED ORDER — AMOXICILLIN-POT CLAVULANATE 875-125 MG PO TABS: 1.0000 | ORAL_TABLET | Freq: Two times a day (BID) | ORAL | 0 refills | Status: AC

## 2019-06-28 NOTE — ED Triage Notes (Signed)
Pt here with c/o dental pain times 2 years , pt is asking for antibiotics so he can go do dentist

## 2019-06-28 NOTE — ED Notes (Signed)
Patient verbalized understanding of dc instructions, vss, ambulatory with nad.   

## 2019-06-28 NOTE — ED Notes (Signed)
ED Provider at bedside. 

## 2019-06-28 NOTE — ED Provider Notes (Signed)
[facility] EMERGENCY DEPARTMENT Provider Note   CSN: [id number] Arrival date & time: 06/28/19  0849     History   Chief Complaint Chief Complaint  Patient presents with  . Dental Pain    HPI Clinton Roach is a 28 y.o. male who presents to the ED with complaints of L upper & R lower dental pain x 2 days.  Patient states that he has chronic intermittent dental problems/infections.  Most recent issue started 2 days ago.  States pain is moderate to severe without alleviating or aggravating factors.  Denies fever, pains or swelling beneath the tongue, dysphagia, sore throat, or trismus. He does not currently see a dentist     HPI  Past Medical History:  Diagnosis Date  . Medical history non-contributory     Patient Active Problem List   Diagnosis Date Noted  . Cellulitis of elbow 04/07/2015  . Olecranon bursitis of left elbow 04/07/2015  . 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 GANGLION CYST EXCISION Left 05/06/2008   resection dorsal cyst left wrist  . GANGLION CYST EXCISION Left 12/17/2012   Procedure: EXCISION MASS LEFT WRIST;  Surgeon: Wyn Forster., MD;  Location: [facility];  Service: Orthopedics;  Laterality: Left;  . LYMPH NODE BIOPSY N/A 07/29/2018   Procedure: LYMPH NODE BIOPSY ANTERIOR NECK;  Surgeon: Serena Colonel, MD;  Location: [facility];  Service: ENT;  Laterality: N/A;        Home Medications    Prior to Admission medications   Medication Sig Start Date End Date Taking? Authorizing Provider  HYDROcodone-acetaminophen (NORCO) 7.5-325 MG tablet Take 1 tablet by mouth every 6 (six) hours as needed for moderate pain. 07/29/18   Serena Colonel, MD  promethazine (PHENERGAN) 25 MG suppository Place 1 suppository (25 mg total) rectally every 6 (six) hours as needed for nausea or vomiting. 07/29/18   Serena Colonel, MD    Family History No  family history on file.  Social History Social History   Tobacco Use  . Smoking status: Former Games developer  . Smokeless tobacco: Never Used  . Tobacco comment: pt states he is not smoking cigarettes anymore  Substance Use Topics  . Alcohol use: Yes    Alcohol/week: 12.0 standard drinks    Types: 12 Cans of beer per week  . Drug use: Yes    Types: Marijuana    Comment: 2x/ week     Allergies   Peanuts [peanut oil] and Shellfish allergy   Review of Systems Review of Systems  Constitutional: Negative for chills and fever.  HENT: Positive for dental problem. Negative for drooling, trouble swallowing and voice change.   Respiratory: Negative for shortness of breath.   Gastrointestinal: Negative for nausea and vomiting.  Musculoskeletal: Negative for neck pain.     Physical Exam Updated Vital Signs BP (!) 163/88 (BP Location: Left Arm)   Pulse 67   Temp 98.4 F (36.9 C) (Oral)   Resp 16   SpO2 97%   Physical Exam Vitals signs and nursing note reviewed.  Constitutional:      General: He is not in acute distress.    Appearance: He is well-developed. He is not toxic-appearing.  HENT:     Head: Normocephalic and atraumatic.     Right Ear: Tympanic membrane is not perforated, erythematous, retracted or bulging.     Left Ear: Tympanic membrane is not perforated,  erythematous, retracted or bulging.     Nose: Nose normal.     Mouth/Throat:     Pharynx: Uvula midline. No oropharyngeal exudate or posterior oropharyngeal erythema.     Comments: Patient has poor dentition throughout.  Left upper and right lower molar region each with dental caries with surrounding gingival erythema and mild swelling.  No palpable fluctuance.  No gross abscess. Posterior oropharynx is symmetric appearing. Patient tolerating own secretions without difficulty. No trismus. No drooling. No hot potato voice. No swelling beneath the tongue, submandibular compartment is soft.  Eyes:     General:         Right eye: No discharge.        Left eye: No discharge.     Conjunctiva/sclera: Conjunctivae normal.  Neck:     Musculoskeletal: Normal range of motion and neck supple. No edema, erythema, neck rigidity, crepitus or muscular tenderness.  Cardiovascular:     Rate and Rhythm: Normal rate.  Pulmonary:     Effort: Pulmonary effort is normal.  Lymphadenopathy:     Cervical: No cervical adenopathy.  Neurological:     Mental Status: He is alert.  Psychiatric:        Behavior: Behavior normal.        Thought Content: Thought content normal.      ED Treatments / Results  Labs (all labs ordered are listed, but only abnormal results are displayed) Labs Reviewed - No data to display  EKG None  Radiology No results found.  Procedures Procedures (including critical care time)  Medications Ordered in ED Medications - No data to display   Initial Impression / Assessment and Plan / ED Course  I have reviewed the triage vital signs and the nursing notes.  Pertinent labs & imaging results that were available during my care of the patient were reviewed by me and considered in my medical decision making (see chart for details).    Patient presents with dental pain. Patient is nontoxic appearing, vitals WNL with the exception of elevated BP- doubt HTN emergency. No gross abscess.  Exam unconcerning for Ludwig's angina or spread of infection.  Will treat with Augmentin and Naproxen.  Urged patient to follow-up with dentist, dental resources were provided.  Discussed treatment plan and need for follow up as well as return precautions. Provided opportunity for questions, patient confirmed understanding and is agreeable to plan.   Final Clinical Impressions(s) / ED Diagnoses   Final diagnoses:  Pain, dental    ED Discharge Orders         Ordered    amoxicillin-clavulanate (AUGMENTIN) 875-125 MG tablet  Every 12 hours     06/28/19 0939    naproxen (NAPROSYN) 500 MG tablet  2 times daily      06/28/19 0939           , Glynda Jaeger, PA-C 06/28/19 [place], [place], DO 06/28/19 1218

## 2019-06-28 NOTE — Discharge Instructions (Signed)
Call one of the dentists offices provided to schedule an appointment for re-evaluation and further management within the next 48 hours.   I have prescribed you Augmentin which is an antibiotic to treat the infection and Naproxen which is an anti-inflammatory medicine to treat the pain.   Please take all of your antibiotics until finished. You may develop abdominal discomfort or diarrhea from the antibiotic.  You may help offset this with probiotics which you can buy at the store (ask your pharmacist if unable to find) or get probiotics in the form of eating yogurt. Do not eat or take the probiotics until 2 hours after your antibiotic. If you are unable to tolerate these side effects follow-up with your primary care provider or return to the emergency department.   If you begin to experience any blistering, rashes, swelling, or difficulty breathing seek medical care for evaluation of potentially more serious side effects.   Be sure to eat something when taking the Naproxen as it can cause stomach upset and at worst stomach bleeding. Do not take additional non steroidal anti-inflammatory medicines such as Ibuprofen, Aleve, Advil, Mobic, Diclofenac, or goodie powder while taking Naproxen. You may supplement with Tylenol.   We have prescribed you new medication(s) today. Discuss the medications prescribed today with your pharmacist as they can have adverse effects and interactions with your other medicines including over the counter and prescribed medications. Seek medical evaluation if you start to experience new or abnormal symptoms after taking one of these medicines, seek care immediately if you start to experience difficulty breathing, feeling of your throat closing, facial swelling, or rash as these could be indications of a more serious allergic reaction  If you start to experience and new or worsening symptoms return to the emergency department. If you start to experience fever, chills, neck  stiffness/pain, or inability to move your neck or open your mouth come back to the emergency department immediately.    Please also follow-up with primary care within 1 month as your blood pressure was elevated, if you do not have a primary care provider please see the phone number circled in your discharge instructions.

## 2022-01-29 ENCOUNTER — Encounter (HOSPITAL_COMMUNITY): Payer: Self-pay

## 2022-01-29 ENCOUNTER — Emergency Department (HOSPITAL_COMMUNITY)
Admission: EM | Admit: 2022-01-29 | Discharge: 2022-01-29 | Disposition: A | Discharge status: Home / Self Care | Payer: BC Managed Care – PPO | Attending: Emergency Medicine | Admitting: Emergency Medicine

## 2022-01-29 DIAGNOSIS — H5711 Ocular pain, right eye: Secondary | ICD-10-CM | POA: Insufficient documentation

## 2022-01-29 DIAGNOSIS — S0501XA Injury of conjunctiva and corneal abrasion without foreign body, right eye, initial encounter: Secondary | ICD-10-CM | POA: Insufficient documentation

## 2022-01-29 DIAGNOSIS — X58XXXA Exposure to other specified factors, initial encounter: Secondary | ICD-10-CM | POA: Insufficient documentation

## 2022-01-29 DIAGNOSIS — H5789 Other specified disorders of eye and adnexa: Secondary | ICD-10-CM

## 2022-01-29 DIAGNOSIS — Y9389 Activity, other specified: Secondary | ICD-10-CM | POA: Insufficient documentation

## 2022-01-29 DIAGNOSIS — Z91010 Allergy to peanuts: Secondary | ICD-10-CM | POA: Insufficient documentation

## 2022-01-29 DIAGNOSIS — Y990 Civilian activity done for income or pay: Secondary | ICD-10-CM | POA: Insufficient documentation

## 2022-01-29 MED ORDER — FLUORESCEIN SODIUM 1 MG OP STRP
1.0000 | ORAL_STRIP | Freq: Once | OPHTHALMIC | Status: AC
  Administered 2022-01-29: 1 via OPHTHALMIC
  Filled 2022-01-29: qty 1

## 2022-01-29 MED ORDER — TETRACAINE HCL 0.5 % OP SOLN
2.0000 [drp] | Freq: Once | OPHTHALMIC | Status: AC
  Administered 2022-01-29: 2 [drp] via OPHTHALMIC
  Filled 2022-01-29: qty 4

## 2022-01-29 MED ORDER — ERYTHROMYCIN 5 MG/GM OP OINT: TOPICAL_OINTMENT | OPHTHALMIC | 0 refills | Status: AC

## 2022-01-29 NOTE — ED Notes (Addendum)
Visual Acuity  Bilateral 20/15 Left eye 20/15 Right eye 20/100

## 2022-01-29 NOTE — ED Provider Notes (Signed)
[facility]-EMERGENCY DEPT Provider Note   CSN: [id number] Arrival date & time: 01/29/22  1258     History  Chief Complaint  Patient presents with   Foreign Body in Eye    [facility] Roach is a 31 y.o. male who presents to the ED with concerns for foreign body sensation to the right eye onset PTA. He notes that he was at work today changing a tire when the valve stem came off and blew air and dirt into his eye. He was wearing safety glasses at the time. Tried OTC visine eye drops without relief of his symptoms. Denies wearing glasses or contacts. Doesn't have an eye doctor at this time. Has associated swelling and redness, blurred vision to right eye. Denies allergies to medications.    The history is provided by the patient. No language interpreter was used.       Home Medications Prior to Admission medications   Medication Sig Start Date End Date Taking? Authorizing Provider  amoxicillin-clavulanate (AUGMENTIN) 875-125 MG tablet Take 1 tablet by mouth every 12 (twelve) hours. 06/28/19   Petrucelli, Pleas Koch, PA-C  HYDROcodone-acetaminophen (NORCO) 7.5-325 MG tablet Take 1 tablet by mouth every 6 (six) hours as needed for moderate pain. 07/29/18   Serena Colonel, MD  naproxen (NAPROSYN) 500 MG tablet Take 1 tablet (500 mg total) by mouth 2 (two) times daily. 06/28/19   Petrucelli, Pleas Koch, PA-C  promethazine (PHENERGAN) 25 MG suppository Place 1 suppository (25 mg total) rectally every 6 (six) hours as needed for nausea or vomiting. 07/29/18   Serena Colonel, MD      Allergies    Peanuts [peanut oil] and Shellfish allergy    Review of Systems   Review of Systems  Constitutional:  Negative for chills and fever.  Eyes:  Positive for pain, redness and visual disturbance. Negative for photophobia, discharge and itching.  All other systems reviewed and are negative.   Physical Exam Updated Vital Signs BP (!) 136/98 (BP Location: Left Arm)   Pulse 62   Temp  98.6 F (37 C) (Oral)   Resp 18   SpO2 97%  Physical Exam Vitals and nursing note reviewed.  Constitutional:      General: He is not in acute distress.    Appearance: Normal appearance. He is not ill-appearing.  HENT:     Head: Normocephalic and atraumatic.     Nose: Nose normal. No congestion or rhinorrhea.     Mouth/Throat:     Mouth: Mucous membranes are moist.     Pharynx: Oropharynx is clear. No oropharyngeal exudate or posterior oropharyngeal erythema.  Eyes:     General: Lids are everted, no foreign bodies appreciated. Vision grossly intact. No visual field deficit or scleral icterus.       Right eye: No foreign body or discharge.        Left eye: No foreign body or discharge.     Extraocular Movements: Extraocular movements intact.     Conjunctiva/sclera: Conjunctivae normal.     Pupils: Pupils are equal, round, and reactive to light.     Comments: Lids everted, no appreciated foreign bodies. EOMI. PERRL. No fluorescein uptake.   Cardiovascular:     Rate and Rhythm: Normal rate.  Pulmonary:     Effort: Pulmonary effort is normal. No respiratory distress.  Musculoskeletal:        General: Normal range of motion.     Cervical back: Neck supple.  Skin:    General: Skin  is warm and dry.     Findings: No rash.  Neurological:     Mental Status: He is alert.     ED Results / Procedures / Treatments   Labs (all labs ordered are listed, but only abnormal results are displayed) Labs Reviewed - No data to display  EKG None  Radiology No results found.  Procedures Procedures    Medications Ordered in ED Medications  fluorescein ophthalmic strip 1 strip (1 strip Right Eye Given by Other 01/29/22 1540)  tetracaine (PONTOCAINE) 0.5 % ophthalmic solution 2 drop (2 drops Right Eye Given by Other 01/29/22 1541)    ED Course/ Medical Decision Making/ A&P Clinical Course as of 01/29/22 1609  Tue Jan 29, 2022  1519 Consult with ophthalmologist, Dr. Nada Libman who  recommends follow-up in outpatient setting tomorrow at 8 AM as well as a prescription for erythromycin. [SB]  1525 Discussed discharge plan with patient at bedside.  Answered all available questions.  Patient appears safe for discharge at this time. [SB]    Clinical Course User Index [SB] Berdia Lachman A, PA-C                           Medical Decision Making Risk Prescription drug management.   Patient presents with concerns for irritation to the right eye onset prior to arrival.  Was wearing safety glasses at the time of the incident.  Denies glasses or contacts use.  Patient does not have an ophthalmologist at this time.  Has blurred vision to the right eye.  Also has redness of the right eye. On exam, bilateral lids everted, no foreign bodies appreciated.  EOM conjunctiva intact.  Conjunctiva without injection. Differential diagnosis includes corneal abrasion, corneal foreign body, traumatic iritis, hyphema.  Visual acuity noted 420/100 to the right eye   Consultations: I requested consultation with the ophthalmologist, Dr. Nada Libman and discussed lab and imaging findings as well as pertinent plan - they recommend: Follow-up for an outpatient appointment in the morning and eating and as well as a prescription for erythromycin.    Disposition: Presentation suspicious for corneal abrasion noted to the right eye and possible irritation.  Patient with relief with tetracaine during exam.  Fluorescein exam negative for acute foreign bodies or corneal abrasion.  Less likely hyphema at this time conjunctive a hemorrhage.  Less likely corneal foreign body, overt foreign body noted on exam.  After consideration of the diagnostic results and the patients response to treatment, I feel that the patient would benefit from Discharge home. Erythromycin ointment ordered in the ED, patient informed on how to take antibiotic ointment.  Patient acknowledges and verbalizes understanding.  Discussed with patient  that he will be provided with information for the on-call ophthalmologist today and has a follow-up appointment tomorrow morning at 8 AM.  Discussed is important that he maintains that appointment. Supportive care measures and strict return precautions discussed with patient at bedside. Pt acknowledges and verbalizes understanding. Pt appears safe for discharge. Follow up as indicated in discharge paperwork.    This chart was dictated using voice recognition software, Dragon. Despite the best efforts of this provider to proofread and correct errors, errors may still occur which can change documentation meaning.  Final Clinical Impression(s) / ED Diagnoses Final diagnoses:  Abrasion of right cornea, initial encounter  Eye irritation    Rx / DC Orders ED Discharge Orders          Ordered  erythromycin ophthalmic ointment        01/29/22 1527              Jolan Mealor A, PA-C 01/29/22 1614    Linwood DibblesKnapp, Jon, MD 01/31/22 0700

## 2022-01-29 NOTE — Discharge Instructions (Signed)
It was a pleasure taking care of you today!   You will be sent a prescription for erythromycin ointment, take as directed.  You have a follow-up appointment with Dr. Nada Libman tomorrow at 8 AM, ensure that you go to that appointment for further evaluation of your symptoms.  Return to the emergency department if you are experiencing increasing/worsening vision changes, headache, dizziness, lightheadedness, worsening symptoms.

## 2022-01-29 NOTE — ED Triage Notes (Signed)
Pt states he was at work today changing a tire when the valve stem came off and blew air and dirt into his eyes. Pt was wearing safety glasses at the time, but states they were blown off his face and he feels as though he has dirt in his R eye. Some swelling and redness noted during triage. Pt states vision is mildly blurry from that eye.
